# Patient Record
Sex: Male | Born: 1935 | Race: White | Hispanic: No | Smoking: Former smoker
Health system: Southern US, Community
[De-identification: ages and names within clinical notes are randomized; demographics above are authoritative.]

## PROBLEM LIST (undated history)

## (undated) DIAGNOSIS — I10 Essential (primary) hypertension: Secondary | ICD-10-CM

## (undated) DIAGNOSIS — K219 Gastro-esophageal reflux disease without esophagitis: Secondary | ICD-10-CM

## (undated) DIAGNOSIS — M069 Rheumatoid arthritis, unspecified: Secondary | ICD-10-CM

## (undated) DIAGNOSIS — J449 Chronic obstructive pulmonary disease, unspecified: Secondary | ICD-10-CM

## (undated) DIAGNOSIS — C4491 Basal cell carcinoma of skin, unspecified: Secondary | ICD-10-CM

## (undated) HISTORY — DX: Essential (primary) hypertension: I10

## (undated) HISTORY — PX: MOHS SURGERY: SHX181

## (undated) HISTORY — DX: Chronic obstructive pulmonary disease, unspecified: J44.9

## (undated) HISTORY — DX: Gastro-esophageal reflux disease without esophagitis: K21.9

---

## 2006-06-24 ENCOUNTER — Other Ambulatory Visit: Payer: Self-pay

## 2006-06-24 ENCOUNTER — Inpatient Hospital Stay: Payer: Self-pay | Admitting: Internal Medicine

## 2006-06-24 ENCOUNTER — Ambulatory Visit: Payer: Self-pay | Admitting: Family Medicine

## 2006-06-27 ENCOUNTER — Other Ambulatory Visit: Payer: Self-pay

## 2008-02-05 ENCOUNTER — Emergency Department: Payer: Self-pay | Admitting: Emergency Medicine

## 2010-09-12 ENCOUNTER — Inpatient Hospital Stay: Payer: Self-pay | Admitting: Internal Medicine

## 2011-12-06 ENCOUNTER — Emergency Department: Payer: Self-pay | Admitting: Emergency Medicine

## 2011-12-06 LAB — COMPREHENSIVE METABOLIC PANEL
Albumin: 2.8 g/dL — ABNORMAL LOW (ref 3.4–5.0)
Anion Gap: 6 — ABNORMAL LOW (ref 7–16)
BUN: 18 mg/dL (ref 7–18)
Bilirubin,Total: 0.4 mg/dL (ref 0.2–1.0)
Chloride: 103 mmol/L (ref 98–107)
EGFR (African American): 53 — ABNORMAL LOW
EGFR (Non-African Amer.): 45 — ABNORMAL LOW
Glucose: 146 mg/dL — ABNORMAL HIGH (ref 65–99)
Osmolality: 284 (ref 275–301)
Potassium: 3.7 mmol/L (ref 3.5–5.1)
Sodium: 140 mmol/L (ref 136–145)
Total Protein: 6.3 g/dL — ABNORMAL LOW (ref 6.4–8.2)

## 2011-12-06 LAB — CK TOTAL AND CKMB (NOT AT ARMC): CK, Total: 37 U/L (ref 35–232)

## 2011-12-06 LAB — CBC
HCT: 30.4 % — ABNORMAL LOW (ref 40.0–52.0)
MCH: 24.4 pg — ABNORMAL LOW (ref 26.0–34.0)
MCHC: 31.3 g/dL — ABNORMAL LOW (ref 32.0–36.0)
MCV: 78 fL — ABNORMAL LOW (ref 80–100)
Platelet: 345 10*3/uL (ref 150–440)
RBC: 3.89 10*6/uL — ABNORMAL LOW (ref 4.40–5.90)
WBC: 10.1 10*3/uL (ref 3.8–10.6)

## 2012-12-05 ENCOUNTER — Emergency Department: Payer: Self-pay | Admitting: Emergency Medicine

## 2012-12-05 LAB — URINALYSIS, COMPLETE
Bacteria: NONE SEEN
Bilirubin,UR: NEGATIVE
Blood: NEGATIVE
Leukocyte Esterase: NEGATIVE
Protein: NEGATIVE
Squamous Epithelial: NONE SEEN
WBC UR: 1 /HPF (ref 0–5)

## 2012-12-05 LAB — BASIC METABOLIC PANEL
Anion Gap: 4 — ABNORMAL LOW (ref 7–16)
Calcium, Total: 8.7 mg/dL (ref 8.5–10.1)
Chloride: 104 mmol/L (ref 98–107)
Glucose: 116 mg/dL — ABNORMAL HIGH (ref 65–99)
Potassium: 3.7 mmol/L (ref 3.5–5.1)

## 2012-12-05 LAB — CBC
HCT: 41.4 % (ref 40.0–52.0)
HGB: 14 g/dL (ref 13.0–18.0)
MCH: 29.9 pg (ref 26.0–34.0)
MCHC: 33.8 g/dL (ref 32.0–36.0)
MCV: 89 fL (ref 80–100)
Platelet: 230 10*3/uL (ref 150–440)
RBC: 4.68 10*6/uL (ref 4.40–5.90)
RDW: 18.6 % — ABNORMAL HIGH (ref 11.5–14.5)
WBC: 9.2 10*3/uL (ref 3.8–10.6)

## 2012-12-07 ENCOUNTER — Emergency Department: Payer: Self-pay | Admitting: Emergency Medicine

## 2012-12-07 LAB — URINALYSIS, COMPLETE
Bacteria: NONE SEEN
Bilirubin,UR: NEGATIVE
Blood: NEGATIVE
Ketone: NEGATIVE
Nitrite: NEGATIVE
Ph: 5 (ref 4.5–8.0)
Specific Gravity: 1.012 (ref 1.003–1.030)
Squamous Epithelial: NONE SEEN
WBC UR: NONE SEEN /HPF (ref 0–5)

## 2012-12-07 LAB — CBC
HCT: 40.7 % (ref 40.0–52.0)
HGB: 13.7 g/dL (ref 13.0–18.0)
MCH: 29.5 pg (ref 26.0–34.0)
MCHC: 33.6 g/dL (ref 32.0–36.0)
MCV: 88 fL (ref 80–100)
Platelet: 258 10*3/uL (ref 150–440)
RBC: 4.62 10*6/uL (ref 4.40–5.90)

## 2012-12-07 LAB — BASIC METABOLIC PANEL
BUN: 24 mg/dL — ABNORMAL HIGH (ref 7–18)
Chloride: 104 mmol/L (ref 98–107)
Creatinine: 1.75 mg/dL — ABNORMAL HIGH (ref 0.60–1.30)
EGFR (African American): 40 — ABNORMAL LOW
Osmolality: 277 (ref 275–301)
Potassium: 3.9 mmol/L (ref 3.5–5.1)
Sodium: 137 mmol/L (ref 136–145)

## 2013-04-06 ENCOUNTER — Ambulatory Visit: Payer: Self-pay | Admitting: Physician Assistant

## 2013-06-21 ENCOUNTER — Ambulatory Visit: Payer: Self-pay | Admitting: Radiation Oncology

## 2013-06-27 ENCOUNTER — Ambulatory Visit: Payer: Self-pay | Admitting: Radiation Oncology

## 2013-07-27 ENCOUNTER — Ambulatory Visit: Payer: Self-pay | Admitting: Radiation Oncology

## 2013-09-30 ENCOUNTER — Ambulatory Visit: Payer: Self-pay | Admitting: Family Medicine

## 2014-01-10 ENCOUNTER — Ambulatory Visit: Payer: Self-pay | Admitting: Ophthalmology

## 2014-01-10 DIAGNOSIS — I10 Essential (primary) hypertension: Secondary | ICD-10-CM

## 2014-01-10 DIAGNOSIS — Z01810 Encounter for preprocedural cardiovascular examination: Secondary | ICD-10-CM

## 2014-01-10 LAB — HEMOGLOBIN: HGB: 11.7 g/dL — AB (ref 13.0–18.0)

## 2014-01-10 LAB — POTASSIUM: Potassium: 3.3 mmol/L — ABNORMAL LOW (ref 3.5–5.1)

## 2014-01-19 ENCOUNTER — Ambulatory Visit: Payer: Self-pay | Admitting: Ophthalmology

## 2014-02-09 ENCOUNTER — Ambulatory Visit: Payer: Self-pay | Admitting: Ophthalmology

## 2014-02-09 LAB — HEMOGLOBIN: HGB: 11.9 g/dL — ABNORMAL LOW (ref 13.0–18.0)

## 2014-02-09 LAB — POTASSIUM: Potassium: 4.2 mmol/L (ref 3.5–5.1)

## 2014-02-21 ENCOUNTER — Ambulatory Visit: Payer: Self-pay | Admitting: Ophthalmology

## 2014-06-15 ENCOUNTER — Ambulatory Visit: Payer: Self-pay | Admitting: Family Medicine

## 2014-08-19 NOTE — Op Note (Signed)
PATIENT NAME:  [name], [name] MR#:  [id number] DATE OF BIRTH:  1924-10-08  DATE OF PROCEDURE:  02/21/2014  PREOPERATIVE DIAGNOSIS:  Nuclear sclerotic cataract of the right eye.   POSTOPERATIVE DIAGNOSIS:  Nuclear sclerotic cataract of the right eye.   OPERATIVE PROCEDURE:  Cataract extraction by phacoemulsification with implant of intraocular lens to right eye.   SURGEON:  Birder Robson, MD.   ANESTHESIA:  1. Managed anesthesia care.  2. Topical tetracaine drops followed by 2% Xylocaine jelly applied in the preoperative holding area.   COMPLICATIONS:  None.   TECHNIQUE:   Stop and chop.    DESCRIPTION OF PROCEDURE:  The patient was examined and consented in the preoperative holding area where the aforementioned topical anesthesia was applied to the right eye and then brought back to the Operating Room where the right eye was prepped and draped in the usual sterile ophthalmic fashion and a lid speculum was placed. A paracentesis was created with the side port blade and the anterior chamber was filled with viscoelastic. A near clear corneal incision was performed with the steel keratome. A continuous curvilinear capsulorrhexis was performed with a cystotome followed by the capsulorrhexis forceps. Hydrodissection and hydrodelineation were carried out with BSS on a blunt cannula. The lens was removed in a stop and check technique and the remaining cortical material was removed with the irrigation-aspiration handpiece. The capsular bag was inflated with viscoelastic and the Tecnis ZCB00 22.5-diopter lens, serial number [id number] was placed in the capsular bag without complication. The remaining viscoelastic was removed from the eye with the irrigation-aspiration handpiece. The wounds were hydrated. The anterior chamber was flushed with Miostat and the eye was inflated to physiologic pressure. Please note that cefuroxime was not placed within the eye due to PENICILLIN ALLERGY. Rather, a 3:1  dilution of Vigamox was placed in the anterior chamber. The wounds were found to be water tight. The eye was dressed with Vigamox. The patient was given protective glasses to wear throughout the day and a shield with which to sleep tonight. The patient was also given drops with which to begin a drop regimen today and will follow-up with me in one day.     ____________________________ Livingston Diones. Keondrick Dilks, MD wlp:AT D: 02/21/2014 15:39:28 ET T: 02/22/2014 03:51:59 ET JOB#: [id number]  cc: Sharica Roedel L. Taima Rada, MD, <Dictator> Livingston Diones Tilly Pernice MD ELECTRONICALLY SIGNED 02/22/2014 14:13

## 2014-08-19 NOTE — Op Note (Signed)
PATIENT NAME:  [name], GODBOLT MR#:  [id number] DATE OF BIRTH:  01/13/1925  DATE OF PROCEDURE:  01/19/2014  PREOPERATIVE DIAGNOSIS: Visually significant cataract of the left eye.   POSTOPERATIVE DIAGNOSIS: Visually significant cataract of the left eye.   OPERATIVE PROCEDURE: Cataract extraction by phacoemulsification with implant of intraocular lens to left eye.   SURGEON: Birder Robson, MD.   ANESTHESIA:  1. Managed anesthesia care.  2. Topical tetracaine drops followed by 2% Xylocaine jelly applied in the preoperative holding area.   COMPLICATIONS: None.   TECHNIQUE:  Stop and chop.    DESCRIPTION OF PROCEDURE: The patient was examined and consented in the preoperative holding area where the aforementioned topical anesthesia was applied to the left eye and then brought back to the Operating Room where the left eye was prepped and draped in the usual sterile ophthalmic fashion and a lid speculum was placed. A paracentesis was created with the side port blade and the anterior chamber was filled with viscoelastic. A near clear corneal incision was performed with the steel keratome. A continuous curvilinear capsulorrhexis was performed with a cystotome followed by the capsulorrhexis forceps. Hydrodissection and hydrodelineation were carried out with BSS on a blunt cannula. The lens was removed in a stop and chop technique and the remaining cortical material was removed with the irrigation-aspiration handpiece. The capsular bag was inflated with viscoelastic and the Tecnis ZCB00 23.0-diopter lens, serial number [id number] was placed in the capsular bag without complication. The remaining viscoelastic was removed from the eye with the irrigation-aspiration handpiece. The wounds were hydrated. The anterior chamber was flushed with Miostat and the eye was inflated to physiologic pressure. Please note that Cefuroxime was not used within the eye due to a PENICILLIN ALLERGY;  rather a 4:1 dilution of  Vigamox was placed. The wounds were found to be water tight. The eye was dressed with Vigamox. The patient was given protective glasses to wear throughout the day and a shield with which to sleep tonight. The patient was also given drops with which to begin a drop regimen today and will follow-up with me in one day.    ____________________________ Livingston Diones. Jasiya Markie, MD wlp:bu D: 01/19/2014 17:09:51 ET T: 01/19/2014 19:38:54 ET JOB#: [id number]  cc: Sylina Henion L. Rhet Rorke, MD, <Dictator> Livingston Diones Flornce Record MD ELECTRONICALLY SIGNED 01/20/2014 9:34

## 2014-08-19 NOTE — Consult Note (Signed)
Diagnosis:  Chief Complaint/Diagnosis   patient is an 81 year old male multiple ear surgeries include almost complete sacrifice of his right year with [facility] chemosurgery for basal cell carcinomas. He is also at least 3 areas radiated on his face and neck according to his daughter. Seen today for persistent recurrent disease deep within the ear canal. Patient had all his treatments at [facility] and I have asked them to consider reevaluation there since he's had 70 fields of radiation and some a surgeries there would best fit his continuity of care. Certainly will offer him radiation therapy here showed a cyst upon that. We have scheduled him for examination and followup in radiation oncology at [facility]. No formal consultation was performed today.  Allergies:   Penicillin: Other  Electronic Signatures: Armstead Peaks (MD)  (Signed 02-Mar-15 15:02)  Authored: Diagnosis, Allergies   Last Updated: 02-Mar-15 15:02 by Armstead Peaks (MD)

## 2014-10-09 ENCOUNTER — Other Ambulatory Visit: Payer: Self-pay | Admitting: Family Medicine

## 2014-10-09 DIAGNOSIS — K219 Gastro-esophageal reflux disease without esophagitis: Secondary | ICD-10-CM

## 2014-11-07 ENCOUNTER — Other Ambulatory Visit: Payer: Self-pay | Admitting: Family Medicine

## 2014-11-07 DIAGNOSIS — K219 Gastro-esophageal reflux disease without esophagitis: Secondary | ICD-10-CM

## 2014-12-05 ENCOUNTER — Other Ambulatory Visit: Payer: Self-pay

## 2014-12-05 DIAGNOSIS — J449 Chronic obstructive pulmonary disease, unspecified: Secondary | ICD-10-CM

## 2014-12-05 MED ORDER — BUDESONIDE-FORMOTEROL FUMARATE 160-4.5 MCG/ACT IN AERO: 2.0000 | INHALATION_SPRAY | Freq: Two times a day (BID) | RESPIRATORY_TRACT | Status: DC

## 2014-12-05 MED ORDER — TIOTROPIUM BROMIDE MONOHYDRATE 18 MCG IN CAPS: 18.0000 ug | ORAL_CAPSULE | Freq: Every day | RESPIRATORY_TRACT | Status: DC

## 2015-01-03 ENCOUNTER — Other Ambulatory Visit: Payer: Self-pay | Admitting: Family Medicine

## 2015-01-03 DIAGNOSIS — I10 Essential (primary) hypertension: Secondary | ICD-10-CM

## 2015-01-09 ENCOUNTER — Ambulatory Visit (INDEPENDENT_AMBULATORY_CARE_PROVIDER_SITE_OTHER): Payer: Medicare Other | Admitting: Family Medicine

## 2015-01-09 ENCOUNTER — Encounter: Payer: Self-pay | Admitting: Family Medicine

## 2015-01-09 VITALS — BP 124/64 | HR 80 | Ht 72.0 in | Wt 141.0 lb

## 2015-01-09 DIAGNOSIS — J449 Chronic obstructive pulmonary disease, unspecified: Secondary | ICD-10-CM

## 2015-01-09 DIAGNOSIS — J432 Centrilobular emphysema: Secondary | ICD-10-CM

## 2015-01-09 DIAGNOSIS — Z23 Encounter for immunization: Secondary | ICD-10-CM

## 2015-01-09 DIAGNOSIS — K219 Gastro-esophageal reflux disease without esophagitis: Secondary | ICD-10-CM

## 2015-01-09 DIAGNOSIS — I10 Essential (primary) hypertension: Secondary | ICD-10-CM | POA: Diagnosis not present

## 2015-01-09 MED ORDER — LISINOPRIL-HYDROCHLOROTHIAZIDE 20-25 MG PO TABS: 1.0000 | ORAL_TABLET | Freq: Every day | ORAL | Status: AC

## 2015-01-09 MED ORDER — BUDESONIDE-FORMOTEROL FUMARATE 160-4.5 MCG/ACT IN AERO: 2.0000 | INHALATION_SPRAY | Freq: Two times a day (BID) | RESPIRATORY_TRACT | Status: AC

## 2015-01-09 MED ORDER — TIOTROPIUM BROMIDE MONOHYDRATE 18 MCG IN CAPS: 18.0000 ug | ORAL_CAPSULE | Freq: Every day | RESPIRATORY_TRACT | Status: AC

## 2015-01-09 MED ORDER — RANITIDINE HCL 150 MG PO TABS: 150.0000 mg | ORAL_TABLET | Freq: Every day | ORAL | Status: AC

## 2015-01-09 NOTE — Progress Notes (Signed)
Name: [name]   MRN: [id number]    DOB: Apr 25, 1925   Date:01/09/2015       Progress Note  Subjective  Chief Complaint  Chief Complaint  Patient presents with  . Hypertension  . Gastrophageal Reflux  . COPD    Hypertension This is a recurrent problem. The current episode started more than 1 year ago. The problem has been gradually improving since onset. The problem is controlled. Pertinent negatives include no anxiety, blurred vision, chest pain, headaches, malaise/fatigue, neck pain, orthopnea, palpitations, peripheral edema, PND, shortness of breath or sweats. There are no associated agents to hypertension. Risk factors for coronary artery disease include male gender, post-menopausal state and smoking/tobacco exposure. Past treatments include diuretics and ACE inhibitors. The current treatment provides mild improvement. There are no compliance problems.  There is no history of angina, kidney disease, CAD/MI, CVA, heart failure, left ventricular hypertrophy, PVD, renovascular disease or retinopathy. There is no history of chronic renal disease.  Gastrophageal Reflux He reports no abdominal pain, no belching, no chest pain, no choking, no coughing, no dysphagia, no early satiety, no globus sensation, no heartburn, no hoarse voice, no nausea, no sore throat, no stridor, no tooth decay, no water brash or no wheezing. This is a chronic problem. The current episode started more than 1 year ago. The problem occurs occasionally. The problem has been gradually improving. The symptoms are aggravated by certain foods. Pertinent negatives include no anemia, fatigue, melena, orthopnea or weight loss. Risk factors include smoking/tobacco exposure. He has tried a histamine-2 antagonist for the symptoms. The treatment provided moderate relief.  Breathing Problem He complains of difficulty breathing. There is no chest tightness, cough, hoarse voice, shortness of breath, sputum production or wheezing.  This is a chronic problem. The current episode started more than 1 year ago. The problem has been unchanged ("fair sometimes"). Associated symptoms include dyspnea on exertion. Pertinent negatives include no chest pain, ear pain, fever, headaches, heartburn, malaise/fatigue, myalgias, orthopnea, PND, sore throat, sweats or weight loss. His symptoms are alleviated by beta-agonist and ipratropium. He reports moderate improvement on treatment.    No problem-specific assessment & plan notes found for this encounter.   Past Medical History  Diagnosis Date  . GERD (gastroesophageal reflux disease)   . Hypertension   . COPD (chronic obstructive pulmonary disease)     Past Surgical History  Procedure Laterality Date  . Mohs surgery      History reviewed. No pertinent family history.  Social History   Social History  . Marital Status: Divorced    Spouse Name: N/A  . Number of Children: N/A  . Years of Education: N/A   Occupational History  . Not on file.   Social History Main Topics  . Smoking status: Former Research scientist (life sciences)  . Smokeless tobacco: Current User    Types: Chew  . Alcohol Use: No  . Drug Use: No  . Sexual Activity: No   Other Topics Concern  . Not on file   Social History Narrative  . No narrative on file    Allergies  Allergen Reactions  . Albuterol Other (See Comments)    trembling, lung infection(?)  . Penicillins Other (See Comments)     Review of Systems  Constitutional: Negative for fever, chills, weight loss, malaise/fatigue and fatigue.  HENT: Negative for ear discharge, ear pain, hoarse voice and sore throat.   Eyes: Negative for blurred vision.  Respiratory: Negative for cough, sputum production, choking, shortness of breath and wheezing.  Cardiovascular: Positive for dyspnea on exertion. Negative for chest pain, palpitations, orthopnea, leg swelling and PND.  Gastrointestinal: Negative for heartburn, dysphagia, nausea, abdominal pain, diarrhea,  constipation, blood in stool and melena.  Genitourinary: Negative for dysuria, urgency, frequency and hematuria.  Musculoskeletal: Negative for myalgias, back pain, joint pain and neck pain.  Skin: Negative for rash.  Neurological: Negative for dizziness, tingling, sensory change, focal weakness and headaches.  Endo/Heme/Allergies: Negative for environmental allergies and polydipsia. Does not bruise/bleed easily.  Psychiatric/Behavioral: Negative for depression and suicidal ideas. The patient is not nervous/anxious and does not have insomnia.      Objective  Filed Vitals:   01/09/15 1040  BP: 124/64  Pulse: 80  Height: 6' (1.829 m)  Weight: 141 lb (63.957 kg)    Physical Exam  Constitutional: He is oriented to person, place, and time and well-developed, well-nourished, and in no distress.  HENT:  Head: Normocephalic.  Right Ear: External ear normal.  Left Ear: External ear normal.  Nose: Nose normal.  Mouth/Throat: Oropharynx is clear and moist.  Eyes: Conjunctivae and EOM are normal. Pupils are equal, round, and reactive to light. Right eye exhibits no discharge. Left eye exhibits no discharge. No scleral icterus.  Neck: Normal range of motion. Neck supple. No JVD present. No tracheal deviation present. No thyromegaly present.  Cardiovascular: Normal rate, regular rhythm, normal heart sounds and intact distal pulses.  Exam reveals no gallop and no friction rub.   No murmur heard. Pulmonary/Chest: Breath sounds normal. No respiratory distress. He has no wheezes. He has no rales.  Abdominal: Soft. Bowel sounds are normal. He exhibits no mass. There is no hepatosplenomegaly. There is no tenderness. There is no rebound, no guarding and no CVA tenderness.  Musculoskeletal: Normal range of motion. He exhibits no edema or tenderness.  Lymphadenopathy:    He has no cervical adenopathy.  Neurological: He is alert and oriented to person, place, and time. He has normal sensation, normal  strength, normal reflexes and intact cranial nerves. No cranial nerve deficit.  Skin: Skin is warm. No rash noted.  Psychiatric: Mood and affect normal.      Assessment & Plan  Problem List Items Addressed This Visit      Cardiovascular and Mediastinum   Essential hypertension - Primary   Relevant Medications   lisinopril-hydrochlorothiazide (PRINZIDE,ZESTORETIC) 20-25 MG per tablet   Other Relevant Orders   Renal Function Panel     Respiratory   Centrilobular emphysema   Relevant Medications   budesonide-formoterol (SYMBICORT) 160-4.5 MCG/ACT inhaler   tiotropium (SPIRIVA HANDIHALER) 18 MCG inhalation capsule     Digestive   Esophageal reflux   Relevant Medications   ranitidine (ZANTAC) 150 MG tablet    Other Visit Diagnoses    Chronic obstructive pulmonary disease, unspecified COPD, unspecified chronic bronchitis type        Relevant Medications    budesonide-formoterol (SYMBICORT) 160-4.5 MCG/ACT inhaler    tiotropium (SPIRIVA HANDIHALER) 18 MCG inhalation capsule    GERD without esophagitis        Relevant Medications    ranitidine (ZANTAC) 150 MG tablet    Need for influenza vaccination        Relevant Orders    Flu Vaccine QUAD 36+ mos PF IM (Fluarix & Fluzone Quad PF) (Completed)         Dr. Deanna Jones [facility]  01/09/2015

## 2015-01-10 LAB — RENAL FUNCTION PANEL
ALBUMIN: 3.6 g/dL (ref 3.5–4.7)
BUN / CREAT RATIO: 16 (ref 10–22)
BUN: 17 mg/dL (ref 8–27)
CALCIUM: 8.8 mg/dL (ref 8.6–10.2)
CHLORIDE: 98 mmol/L (ref 97–108)
CO2: 29 mmol/L (ref 18–29)
Creatinine, Ser: 1.05 mg/dL (ref 0.76–1.27)
GFR calc Af Amer: 73 mL/min/{1.73_m2} (ref >59)
GFR calc non Af Amer: 63 mL/min/{1.73_m2} (ref >59)
Glucose: 58 mg/dL — ABNORMAL LOW (ref 65–99)
Phosphorus: 2.8 mg/dL (ref 2.5–4.5)
Potassium: 3.8 mmol/L (ref 3.5–5.2)
Sodium: 142 mmol/L (ref 134–144)

## 2015-04-06 ENCOUNTER — Encounter: Payer: Medicare Other | Attending: Surgery | Admitting: Surgery

## 2015-04-06 DIAGNOSIS — Z880 Allergy status to penicillin: Secondary | ICD-10-CM | POA: Insufficient documentation

## 2015-04-06 DIAGNOSIS — Z923 Personal history of irradiation: Secondary | ICD-10-CM | POA: Diagnosis not present

## 2015-04-06 DIAGNOSIS — Z9221 Personal history of antineoplastic chemotherapy: Secondary | ICD-10-CM | POA: Diagnosis not present

## 2015-04-06 DIAGNOSIS — C4440 Unspecified malignant neoplasm of skin of scalp and neck: Secondary | ICD-10-CM | POA: Insufficient documentation

## 2015-04-06 DIAGNOSIS — K219 Gastro-esophageal reflux disease without esophagitis: Secondary | ICD-10-CM | POA: Insufficient documentation

## 2015-04-06 DIAGNOSIS — M069 Rheumatoid arthritis, unspecified: Secondary | ICD-10-CM | POA: Insufficient documentation

## 2015-04-06 DIAGNOSIS — J449 Chronic obstructive pulmonary disease, unspecified: Secondary | ICD-10-CM | POA: Diagnosis not present

## 2015-04-06 DIAGNOSIS — I10 Essential (primary) hypertension: Secondary | ICD-10-CM | POA: Insufficient documentation

## 2015-04-06 DIAGNOSIS — C44319 Basal cell carcinoma of skin of other parts of face: Secondary | ICD-10-CM | POA: Diagnosis present

## 2015-04-06 DIAGNOSIS — L98492 Non-pressure chronic ulcer of skin of other sites with fat layer exposed: Secondary | ICD-10-CM | POA: Insufficient documentation

## 2015-04-06 DIAGNOSIS — Z87891 Personal history of nicotine dependence: Secondary | ICD-10-CM | POA: Insufficient documentation

## 2015-04-07 NOTE — Progress Notes (Addendum)
[name], [name] ([id number]) Visit Report for 04/06/2015 Chief Complaint Document Details Patient Name: [name], [name]. Date of Service: 04/06/2015 12:45 PM Medical Record Number: [id number] Patient Account Number: [id number] Date of Birth/Sex: Feb 06, 1925 (81 y.o. Male) Treating RN: Cornell Barman Primary Care Physician: Otilio Miu Other Clinician: Referring Physician: SELF, REFERRED Treating Physician/Extender: Frann Rider in Treatment: 0 Information Obtained from: Patient Chief Complaint Patient presents to the wound care center for a consult due non healing wound to the right ear and neck which has been there for several years and is now getting progressively worse. An open ulceration on the right neck has been there for about 4 months Electronic Signature(s) Signed: 04/06/2015 2:57:55 PM By: Christin Fudge MD, FACS Entered By: Christin Fudge on 04/06/2015 14:57:55 [name], [name] ([id number]) -------------------------------------------------------------------------------- HPI Details Patient Name: [name]. Date of Service: 04/06/2015 12:45 PM Medical Record Number: [id number] Patient Account Number: [id number] Date of Birth/Sex: 14-Oct-1924 (81 y.o. Male) Treating RN: Cornell Barman Primary Care Physician: Otilio Miu Other Clinician: Referring Physician: SELF, REFERRED Treating Physician/Extender: Frann Rider in Treatment: 0 History of Present Illness Location: ulcerated area right neck Quality: Patient reports experiencing a shooting pain to affected area(s). Severity: Patient states wound are getting worse. Duration: Patient has had the wound for > 3 months prior to seeking treatment at the wound center Timing: Pain in wound is constant (hurts all the time) Context: The wound appeared gradually over time Modifying Factors: Consults to this date include:dermatology, PCP and oncology Associated Signs and Symptoms: Patient reports having increase  swelling. 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 He initially had excisional surgery and the disease got progressively worse and recurred. He then had radiation therapy in about 2014. Patient continued to have progressive disease and then had a large neck mass which was treated with Erivedge and this was given to him regularly right up to May. The patient had a lot of issues with the chemotherapy and with radiation therapy and has stopped all treatment and did not want to go back to the cancer center. His daughter was at the bedside has been doing his dressings and noted that this has now ulcerated over the last 3 months and she has been doing local care and was concerned about infection. I do not believe she understands the gravity of the situation. Electronic Signature(s) Signed: 04/06/2015 3:29:50 PM By: Christin Fudge MD, FACS Entered By: Christin Fudge on 04/06/2015 15:29:50 [name], [name] ([id number]) -------------------------------------------------------------------------------- Physical Exam Details Patient Name: [name], [name]. Date of Service: 04/06/2015 12:45 PM Medical Record Number: [id number] Patient Account Number: [id number] Date of Birth/Sex: January 20, 1925 (81 y.o. Male) Treating RN: Cornell Barman Primary Care Physician: Otilio Miu Other Clinician: Referring Physician: SELF, REFERRED Treating Physician/Extender: Frann Rider in Treatment: 0 Constitutional . Pulse regular. Respirations normal and unlabored. Afebrile. . Eyes Nonicteric. Reactive to light. Ears, Nose, Mouth, and Throat Lips, teeth, and gums WNL.Marland Kitchen Moist mucosa without lesions . Neck the patient has part of his right ear excised and he has a large hard fungating mass in the region of his mastoid and upper neck region with ulceration down to the subcutaneous tissue.. Normal sized without goiter. Respiratory WNL. No  retractions.. Cardiovascular Pedal Pulses WNL. No clubbing, cyanosis or edema. Gastrointestinal (GI) Abdomen without masses or tenderness.. No liver or spleen enlargement or tenderness.. Lymphatic No adneopathy. No adenopathy. No adenopathy. Musculoskeletal Adexa without tenderness or enlargement.. Digits  and nails w/o clubbing, cyanosis, infection, petechiae, ischemia, or inflammatory conditions.. Integumentary (Hair, Skin) No suspicious lesions. No crepitus or fluctuance. No peri-wound warmth or erythema. No masses.Marland Kitchen Psychiatric Judgement and insight Intact.. No evidence of depression, anxiety, or agitation.. Notes the patient has part of his right ear excised and he has a large hard fungating mass in the region of his mastoid and upper neck region with ulceration down to the subcutaneous tissue. the mass is fixed and tender and there is a fungating base. Electronic Signature(s) Signed: 04/06/2015 3:31:40 PM By: Christin Fudge MD, FACS Entered By: Christin Fudge on 04/06/2015 15:31:40 [name], [name] ([id number]) -------------------------------------------------------------------------------- Physician Orders Details Patient Name: [name] Date of Service: 04/06/2015 12:45 PM Medical Record Number: [id number] Patient Account Number: [id number] Date of Birth/Sex: Apr 21, 1925 (81 y.o. Male) Treating RN: Cornell Barman Primary Care Physician: Otilio Miu Other Clinician: Referring Physician: SELF, REFERRED Treating Physician/Extender: Frann Rider in Treatment: 0 Verbal / Phone Orders: Yes Clinician: Cornell Barman Read Back and Verified: Yes Diagnosis Coding Wound Cleansing Wound #1 Right Neck o Clean wound with Normal Saline. Wound #2 Right Ear o Clean wound with Normal Saline. Anesthetic Wound #1 Right Neck o Topical Lidocaine 4% cream applied to wound bed prior to debridement Wound #2 Right Ear o Topical Lidocaine 4% cream applied to wound bed prior to  debridement Primary Wound Dressing Wound #1 Right Neck o Aquacel Ag Wound #2 Right Ear o Aquacel Ag Secondary Dressing Wound #1 Right Neck o Non-adherent pad o ABD pad Wound #2 Right Ear o Non-adherent pad o ABD pad Dressing Change Frequency Wound #1 Right Neck o Change dressing every day. Wound #2 Right Ear o Change dressing every day. JUSTO, MOWBRAY ([id number]) Follow-up Appointments Wound #1 Right Neck o Return Appointment in 1 week. Wound #2 Right Ear o Return Appointment in 1 week. Electronic Signature(s) Signed: 04/06/2015 3:49:12 PM By: Gretta Cool RN, BSN, Kim RN, BSN Signed: 04/06/2015 4:13:31 PM By: Christin Fudge MD, FACS Entered By: Gretta Cool RN, BSN, Kim on 04/06/2015 13:55:55 [name], [name] ([id number]) -------------------------------------------------------------------------------- Problem List Details Patient Name: [name], [name]. Date of Service: 04/06/2015 12:45 PM Medical Record Number: [id number] Patient Account Number: [id number] Date of Birth/Sex: 1925-02-03 (81 y.o. 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 Date of Service: 04/06/2015 12:45  PM Medical Record Number: [id number] Patient Account Number: [id number] Date of Birth/Sex: [birth year] (81 y.o. Male) Treating RN: Cornell Barman Primary Care Physician: Otilio Miu Other Clinician: Referring Physician: SELF, REFERRED Treating Physician/Extender: Frann Rider in Treatment: 0 Subjective Chief Complaint Information obtained from Patient Patient presents to the wound care center for a consult due non healing wound to the right ear and neck which has been there for several years and is now getting progressively worse. An open ulceration on the right neck has been there for about 4 months History of Present Illness (HPI) The following HPI elements were  documented for the patient's wound: Location: ulcerated area right neck Quality: Patient reports experiencing a shooting pain to affected area(s). Severity: Patient states wound are getting worse. Duration: Patient has had the wound for > 3 months prior to seeking treatment at the wound center Timing: Pain in wound is constant (hurts all the time) Context: The wound appeared gradually over time Modifying Factors: Consults to this date include:dermatology, PCP and oncology Associated Signs and Symptoms: Patient reports having increase swelling. 81 year old gentleman who is known to have a basal cell carcinoma of the right ear in 2012. He initially had excisional surgery and the disease got progressively worse and recurred. He then had radiation therapy in about 2014. Patient continued to have progressive disease and then had a large neck mass which was treated with Erivedge and this was given to him regularly right up to May. The patient had a lot of issues with the chemotherapy and with radiation therapy and has stopped all treatment and did not want to go back to the cancer center. His daughter was at the bedside has been doing his dressings and noted that this has now ulcerated over the last 3 months and she has been  doing local care and was concerned about infection. I do not believe she understands the gravity of the situation. Wound History Patient presents with 1 open wound that has been present for approximately 2 mos. Patient has been treating wound in the following manner: cream, gauze. Laboratory tests have not been performed in the last month. Patient reportedly has not tested positive for an antibiotic resistant organism. Patient reportedly has not tested positive for osteomyelitis. Patient experiences the following problems associated with their wounds: infection. Patient History Information obtained from Patient, Caregiver. ANAKIN, NOLETTE ([id number]) Allergies penicillin Family History Stroke - Mother, Father, Paternal Grandparents, Maternal Grandparents, No family history of Cancer, Diabetes, Heart Disease, Hypertension, Kidney Disease, Lung Disease, Seizures, Thyroid Problems, Tuberculosis. Social History Former smoker, Marital Status - Divorced, Alcohol Use - Never, Drug Use - No History, Caffeine Use - Daily. Medical History Eyes Patient has history of Cataracts - November 2015 Denies history of Glaucoma, Optic Neuritis Ear/Nose/Mouth/Throat Denies history of Chronic sinus problems/congestion, Middle ear problems Hematologic/Lymphatic Patient has history of Anemia Denies history of Hemophilia, Human Immunodeficiency Virus, Lymphedema, Sickle Cell Disease Respiratory Patient has history of Chronic Obstructive Pulmonary Disease (COPD) Denies history of Aspiration, Pneumothorax, Sleep Apnea, Tuberculosis Cardiovascular Patient has history of Hypertension Denies history of Angina, Arrhythmia, Congestive Heart Failure, Coronary Artery Disease, Deep Vein Thrombosis, Hypotension, Myocardial Infarction, Peripheral Arterial Disease, Peripheral Venous Disease, Phlebitis, Vasculitis Gastrointestinal Denies history of Cirrhosis , Colitis, Crohn s, Hepatitis A, Hepatitis B, Hepatitis  C Endocrine Denies history of Type I Diabetes, Type II Diabetes Genitourinary Denies history of End Stage Renal Disease Immunological Denies history of Lupus Erythematosus, Raynaud s, Scleroderma Integumentary (Skin) Denies history of History of Burn, History of pressure wounds Musculoskeletal Patient has history of Rheumatoid Arthritis Denies history of Gout, Osteoarthritis, Osteomyelitis Neurologic Denies history of Dementia, Neuropathy, Quadriplegia, Paraplegia Oncologic Patient has history of Received Chemotherapy, Received Radiation Psychiatric Denies history of Anorexia/bulimia, Confinement Anxiety Medical And Surgical History Notes [name], [name] ([id number]) Constitutional Symptoms (General Health) Basal Cell; Rheumatoud Arth; COPD; HTN; Gastric Reflux Review of Systems (ROS) Constitutional Symptoms (General Health) The patient has no complaints or symptoms. Eyes The patient has no complaints or symptoms. Ear/Nose/Mouth/Throat The patient has no complaints or symptoms. Hematologic/Lymphatic The patient has no complaints or symptoms. Respiratory Complains or  has symptoms of Shortness of Breath. Cardiovascular The patient has no complaints or symptoms. Gastrointestinal The patient has no complaints or symptoms. Endocrine The patient has no complaints or symptoms. Genitourinary The patient has no complaints or symptoms. Immunological The patient has no complaints or symptoms. Integumentary (Skin) Complains or has symptoms of Wounds, Bleeding or bruising tendency. Musculoskeletal The patient has no complaints or symptoms. Neurologic The patient has no complaints or symptoms. Oncologic Basal Cell Psychiatric The patient has no complaints or symptoms. Objective Constitutional Pulse regular. Respirations normal and unlabored. Afebrile. Vitals Time Taken: 1:06 PM, Height: 72 in, Weight: 141 lbs, BMI: 19.1, Temperature: 97.5 F, Pulse: 107 bpm, Respiratory  Rate: 20 breaths/min, Blood Pressure: 114/67 mmHg. Eyes Ceballos, Tredarius C. ([id number]) Nonicteric. Reactive to light. Ears, Nose, Mouth, and Throat Lips, teeth, and gums WNL.Marland Kitchen Moist mucosa without lesions . Neck the patient has part of his right ear excised and he has a large hard fungating mass in the region of his mastoid and upper neck region with ulceration down to the subcutaneous tissue.. Normal sized without goiter. Respiratory WNL. No retractions.. Cardiovascular Pedal Pulses WNL. No clubbing, cyanosis or edema. Gastrointestinal (GI) Abdomen without masses or tenderness.. No liver or spleen enlargement or tenderness.. Lymphatic No adneopathy. No adenopathy. No adenopathy. Musculoskeletal Adexa without tenderness or enlargement.. Digits and nails w/o clubbing, cyanosis, infection, petechiae, ischemia, or inflammatory conditions.Marland Kitchen Psychiatric Judgement and insight Intact.. No evidence of depression, anxiety, or agitation.. General Notes: the patient has part of his right ear excised and he has a large hard fungating mass in the region of his mastoid and upper neck region with ulceration down to the subcutaneous tissue. the mass is fixed and tender and there is a fungating base. Integumentary (Hair, Skin) No suspicious lesions. No crepitus or fluctuance. No peri-wound warmth or erythema. No masses.. Wound #1 status is Open. Original cause of wound was Gradually Appeared. The wound is located on the Right Neck. The wound measures 3cm length x 2.8cm width x 1.5cm depth; 6.597cm^2 area and 9.896cm^3 volume. The wound is limited to skin breakdown. There is a large amount of serous drainage noted. The wound margin is well defined and not attached to the wound base. There is no granulation within the wound bed. There is a large (67-100%) amount of necrotic tissue within the wound bed including Adherent Slough. The periwound skin appearance exhibited: Maceration, Moist. The periwound  skin appearance did not exhibit: Callus, Crepitus, Excoriation, Fluctuance, Friable, Induration, Localized Edema, Rash, Scarring, Dry/Scaly, Atrophie Blanche, Cyanosis, Ecchymosis, Hemosiderin Staining, Mottled, Pallor, Rubor, Erythema. Periwound temperature was noted as No Abnormality. The periwound has tenderness on palpation. Wound #2 status is Open. Original cause of wound was Gradually Appeared. The wound is located on the Right Ear. The wound measures 2cm length x 1.5cm width x 0.1cm depth; 2.356cm^2 area and 0.236cm^3 volume. The wound is limited to skin breakdown. There is a medium amount of serous drainage noted. The [name], [name] ([id number]) wound margin is indistinct and nonvisible. There is no granulation within the wound bed. There is a large (67-100%) amount of necrotic tissue within the wound bed including Eschar. The periwound skin appearance exhibited: Scarring. The periwound skin appearance did not exhibit: Callus, Crepitus, Excoriation, Fluctuance, Friable, Induration, Localized Edema, Rash, Dry/Scaly, Maceration, Moist, Atrophie Blanche, Cyanosis, Ecchymosis, Hemosiderin Staining, Mottled, Pallor, Rubor, Erythema. Assessment Active Problems ICD-10 C44.319 - Basal cell carcinoma of skin of other parts of face C44.40 - Unspecified malignant neoplasm of skin of scalp and neck  [id number] - Non-pressure chronic ulcer of skin of other sites with fat layer exposed This 81 year old gentleman has a locally advanced, possible metastatic basal cell carcinoma which is in his right upper neck and angle of the mandible region. This tumor is fungating, fixed and tender. The daughter who is his caregiver and I have had detailed discussion regarding the pathophysiology of his problem and I have clearly explained that the management offered at our wound center is going to be palliative and in no way going to help cure this or heal this ulcer. She understands this. She did not comprehend  the gravity of this disease and how it may end up and after answering all her questions I believe she clearly understands the prognosis, which is dismal. The patient is not willing to undergo any further investigations or treatment at the [facility] and hence at this stage I understand he is here for palliative treatment only. I have recommended gently packing this wound with Aquacel Ag rope and covering it with gauze and tape to keep in place. We would be happy to see the patient back as often as needed and we do try control order and discharge from this wound. Plan Wound Cleansing: Wound #1 Right Neck: Clean wound with Normal Saline. Wound #2 Right Ear: Clean wound with Normal Saline. Anesthetic: [name], [name] ([id number]) Wound #1 Right Neck: Topical Lidocaine 4% cream applied to wound bed prior to debridement Wound #2 Right Ear: Topical Lidocaine 4% cream applied to wound bed prior to debridement Primary Wound Dressing: Wound #1 Right Neck: Aquacel Ag Wound #2 Right Ear: Aquacel Ag Secondary Dressing: Wound #1 Right Neck: Non-adherent pad ABD pad Wound #2 Right Ear: Non-adherent pad ABD pad Dressing Change Frequency: Wound #1 Right Neck: Change dressing every day. Wound #2 Right Ear: Change dressing every day. Follow-up Appointments: Wound #1 Right Neck: Return Appointment in 1 week. Wound #2 Right Ear: Return Appointment in 1 week. This 81 year old gentleman has a locally advanced, possible metastatic basal cell carcinoma which is in his right upper neck and angle of the mandible region. This tumor is fungating, fixed and tender. The daughter who is his caregiver and I have had detailed discussion regarding the pathophysiology of his problem and I have clearly explained that the management offered at our wound center is going to be palliative and in no way going to help cure this or heal this ulcer. She understands this. She did not comprehend the  gravity of this disease and how it may end up and after answering all her questions I believe she clearly understands the prognosis, which is dismal. The patient is not willing to undergo any further investigations or treatment at the [facility] and hence at this stage I understand he is here for palliative treatment only. I have recommended gently packing this wound with Aquacel Ag rope and covering it with gauze and tape to keep in place. We would be happy to see the patient back as often as needed and we do try control order and discharge from this wound. GORO, SHOWELL ([id number]) Electronic Signature(s) Signed: 04/09/2015 4:37:42 PM By: Christin Fudge MD, FACS Previous Signature: 04/06/2015 3:55:32 PM Version By: Christin Fudge MD, FACS Previous Signature: 04/06/2015 3:36:34 PM Version By: Christin Fudge MD, FACS Entered By: Christin Fudge on 04/09/2015 16:37:42 [place], [name] ([id number]) -------------------------------------------------------------------------------- ROS/PFSH Details Patient Name: [name]. Date of Service: 04/06/2015 12:45 PM Medical Record Number: [id number] Patient Account Number: [id number] Date of  Birth/Sex: 01/06/1925 (81 y.o. Male) Treating RN: Cornell Barman Primary Care Physician: Otilio Miu Other Clinician: Referring Physician: SELF, REFERRED Treating Physician/Extender: Frann Rider in Treatment: 0 Information Obtained From Patient Caregiver Wound History Do you currently have one or more open woundso Yes How many open wounds do you currently haveo 1 Approximately how long have you had your woundso 2 mos How have you been treating your wound(s) until nowo cream, gauze Has your wound(s) ever healed and then re-openedo No Have you had any lab work done in the past montho No Have you tested positive for an antibiotic resistant organism (MRSA, VRE)o No Have you tested positive for osteomyelitis (bone infection)o No Have you had  other problems associated with your woundso Infection Respiratory Complaints and Symptoms: Positive for: Shortness of Breath Medical History: Positive for: Chronic Obstructive Pulmonary Disease (COPD) Negative for: Aspiration; Pneumothorax; Sleep Apnea; Tuberculosis Integumentary (Skin) Complaints and Symptoms: Positive for: Wounds; Bleeding or bruising tendency Medical History: Negative for: History of Burn; History of pressure wounds Constitutional Symptoms (General Health) Complaints and Symptoms: No Complaints or Symptoms Medical History: Past Medical History Notes: Basal Cell; Rheumatoud Arth; COPD; HTN; Gastric Reflux Eyes [name], Jamelle C. ([id number]) Complaints and Symptoms: No Complaints or Symptoms Medical History: Positive for: Cataracts - November 2015 Negative for: Glaucoma; Optic Neuritis Ear/Nose/Mouth/Throat Complaints and Symptoms: No Complaints or Symptoms Medical History: Negative for: Chronic sinus problems/congestion; Middle ear problems Hematologic/Lymphatic Complaints and Symptoms: No Complaints or Symptoms Medical History: Positive for: Anemia Negative for: Hemophilia; Human Immunodeficiency Virus; Lymphedema; Sickle Cell Disease Cardiovascular Complaints and Symptoms: No Complaints or Symptoms Medical History: Positive for: Hypertension Negative for: Angina; Arrhythmia; Congestive Heart Failure; Coronary Artery Disease; Deep Vein Thrombosis; Hypotension; Myocardial Infarction; Peripheral Arterial Disease; Peripheral Venous Disease; Phlebitis; Vasculitis Gastrointestinal Complaints and Symptoms: No Complaints or Symptoms Medical History: Negative for: Cirrhosis ; Colitis; Crohnos; Hepatitis A; Hepatitis B; Hepatitis C Endocrine Complaints and Symptoms: No Complaints or Symptoms Medical History: Negative for: Type I Diabetes; Type II Diabetes Arndt, Itamar C. ([id number]) Genitourinary Complaints and Symptoms: No Complaints or  Symptoms Medical History: Negative for: End Stage Renal Disease Immunological Complaints and Symptoms: No Complaints or Symptoms Medical History: Negative for: Lupus Erythematosus; Raynaudos; Scleroderma Musculoskeletal Complaints and Symptoms: No Complaints or Symptoms Medical History: Positive for: Rheumatoid Arthritis Negative for: Gout; Osteoarthritis; Osteomyelitis Neurologic Complaints and Symptoms: No Complaints or Symptoms Medical History: Negative for: Dementia; Neuropathy; Quadriplegia; Paraplegia Oncologic Complaints and Symptoms: Review of System Notes: Basal Cell Medical History: Positive for: Received Chemotherapy; Received Radiation Psychiatric Complaints and Symptoms: No Complaints or Symptoms Medical History: Negative for: Anorexia/bulimia; Confinement Anxiety HBO Extended History Items HARSH, SCIPIO ([id number]) Eyes: Cataracts Family and Social History Cancer: No; Diabetes: No; Heart Disease: No; Hypertension: No; Kidney Disease: No; Lung Disease: No; Seizures: No; Stroke: Yes - Mother, Father, Paternal Grandparents, Maternal Grandparents; Thyroid Problems: No; Tuberculosis: No; Former smoker; Marital Status - Divorced; Alcohol Use: Never; Drug Use: No History; Caffeine Use: Daily; Living Will: No; Medical Power of Attorney: No Physician Affirmation I have reviewed and agree with the above information. Electronic Signature(s) Signed: 04/06/2015 3:55:16 PM By: Christin Fudge MD, FACS Signed: 04/06/2015 5:05:36 PM By: Gretta Cool RN, BSN, Kim RN, BSN Previous Signature: 04/06/2015 3:49:12 PM Version By: Gretta Cool RN, BSN, Kim RN, BSN Entered By: Christin Fudge on 04/06/2015 15:55:16 Hiltz, [name] ([id number]) -------------------------------------------------------------------------------- [place] Details Patient Name: AASIN, DENHART. Date of Service: 04/06/2015 Medical Record Number: [id number] Patient Account Number: [id number] Date of Birth/Sex:  1925/03/21 (  81 y.o. Male) Treating RN: Cornell Barman Primary Care Physician: Otilio Miu Other Clinician: Referring Physician: SELF, REFERRED Treating Physician/Extender: Frann Rider in Treatment: 0 Diagnosis Coding ICD-10 Codes Code Description [id number] Basal cell carcinoma of skin of other parts of face C44.40 Unspecified malignant neoplasm of skin of scalp and neck L98.492 Non-pressure chronic ulcer of skin of other sites with fat layer exposed Facility Procedures CPT4 Code: [id number] Description: 99214 - WOUND CARE VISIT-LEV 4 EST PT Modifier: Quantity: 1 Physician Procedures CPT4 Code Description: [id number] - WC PHYS LEVEL 4 - NEW PT ICD-10 Description Diagnosis C44.319 Basal cell carcinoma of skin of other parts of face C44.40 Unspecified malignant neoplasm of skin of scalp and L98.492 Non-pressure chronic ulcer of skin  of other sites w Modifier: neck ith fat layer Quantity: 1 exposed Electronic Signature(s) Signed: 04/06/2015 3:36:56 PM By: Christin Fudge MD, FACS Entered By: Christin Fudge on 04/06/2015 15:36:56

## 2015-04-07 NOTE — Progress Notes (Addendum)
TAJON, PRESTAGE ([id number]) Visit Report for 04/06/2015 Allergy List Details Patient Name: [name], KENWORTHY. Date of Service: 04/06/2015 12:45 PM Medical Record Number: [id number] Patient Account Number: [id number] Date of Birth/Sex: April 29, 1924 (81 y.o. Male) Treating RN: Cornell Barman Primary Care Physician: Otilio Miu Other Clinician: Referring Physician: SELF, REFERRED Treating Physician/Extender: Frann Rider in Treatment: 0 Allergies Active Allergies penicillin Allergy Notes Electronic Signature(s) Signed: 04/06/2015 3:49:12 PM By: Gretta Cool, RN, BSN, Kim RN, BSN Entered By: Gretta Cool, RN, BSN, Kim on 04/06/2015 13:15:59 Awan, [name] ([id number]) -------------------------------------------------------------------------------- Arrival Information Details Patient Name: [name] Date of Service: 04/06/2015 12:45 PM Medical Record Number: [id number] Patient Account Number: [id number] Date of Birth/Sex: 06-Jun-1924 (81 y.o. Male) Treating RN: Cornell Barman Primary Care Physician: Otilio Miu Other Clinician: Referring Physician: SELF, REFERRED Treating Physician/Extender: Frann Rider in Treatment: 0 Visit Information Patient Arrived: Ambulatory Arrival Time: 13:04 Accompanied By: daughter, [name] Transfer Assistance: None Patient Identification Verified: Yes Secondary Verification Process Yes Completed: Patient Has Alerts: Yes Patient Alerts: Patient on Blood Thinner Electronic Signature(s) Signed: 04/06/2015 3:49:12 PM By: Gretta Cool, RN, BSN, Kim RN, BSN Entered By: Gretta Cool, RN, BSN, Kim on 04/06/2015 15:19:19 Blaschke, [name] ([id number]) -------------------------------------------------------------------------------- Clinic Level of Care Assessment Details Patient Name: [name], [name]. Date of Service: 04/06/2015 12:45 PM Medical Record Number: [id number] Patient Account Number: [id number] Date of Birth/Sex: February 25, 1925 (81 y.o. Male) Treating RN:  Cornell Barman Primary Care Physician: Otilio Miu Other Clinician: Referring Physician: SELF, REFERRED Treating Physician/Extender: Frann Rider in Treatment: 0 Clinic Level of Care Assessment Items TOOL 2 Quantity Score []  - Use when only an EandM is performed on the INITIAL visit 0 ASSESSMENTS - Nursing Assessment / Reassessment X - General Physical Exam (combine w/ comprehensive assessment (listed just 1 20 below) when performed on new pt. evals) X - Comprehensive Assessment (HX, ROS, Risk Assessments, Wounds Hx, etc.) 1 25 ASSESSMENTS - Wound and Skin Assessment / Reassessment X - Simple Wound Assessment / Reassessment - one wound 1 5 []  - Complex Wound Assessment / Reassessment - multiple wounds 0 []  - Dermatologic / Skin Assessment (not related to wound area) 0 ASSESSMENTS - Ostomy and/or Continence Assessment and Care []  - Incontinence Assessment and Management 0 []  - Ostomy Care Assessment and Management (repouching, etc.) 0 PROCESS - Coordination of Care X - Simple Patient / Family Education for ongoing care 1 15 []  - Complex (extensive) Patient / Family Education for ongoing care 0 []  - Staff obtains Programmer, systems, Records, Test Results / Process Orders 0 []  - Staff telephones HHA, Nursing Homes / Clarify orders / etc 0 []  - Routine Transfer to another Facility (non-emergent condition) 0 []  - Routine Hospital Admission (non-emergent condition) 0 X - New Admissions / Biomedical engineer / Ordering NPWT, Apligraf, etc. 1 15 []  - Emergency Hospital Admission (emergent condition) 0 X - Simple Discharge Coordination 1 10 Blacklock, Kee C. ([id number]) []  - Complex (extensive) Discharge Coordination 0 PROCESS - Special Needs []  - Pediatric / Minor Patient Management 0 []  - Isolation Patient Management 0 []  - Hearing / Language / Visual special needs 0 []  - Assessment of Community assistance (transportation, D/C planning, etc.) 0 []  - Additional assistance / Altered  mentation 0 []  - Support Surface(s) Assessment (bed, cushion, seat, etc.) 0 INTERVENTIONS - Wound Cleansing / Measurement X - Wound Imaging (photographs - any number of wounds) 1 5 []  - Wound Tracing (instead of photographs) 0 X - Simple Wound Measurement - one wound 1 5 []  -  Complex Wound Measurement - multiple wounds 0 X - Simple Wound Cleansing - one wound 1 5 []  - Complex Wound Cleansing - multiple wounds 0 INTERVENTIONS - Wound Dressings []  - Small Wound Dressing one or multiple wounds 0 X - Medium Wound Dressing one or multiple wounds 2 15 []  - Large Wound Dressing one or multiple wounds 0 []  - Application of Medications - injection 0 INTERVENTIONS - Miscellaneous []  - External ear exam 0 []  - Specimen Collection (cultures, biopsies, blood, body fluids, etc.) 0 []  - Specimen(s) / Culture(s) sent or taken to Lab for analysis 0 []  - Patient Transfer (multiple staff / Civil Service fast streamer / Similar devices) 0 []  - Simple Staple / Suture removal (25 or less) 0 []  - Complex Staple / Suture removal (26 or more) 0 Hebb, Janoah C. ([id number]) []  - Hypo / Hyperglycemic Management (close monitor of Blood Glucose) 0 []  - Ankle / Brachial Index (ABI) - do not check if billed separately 0 Has the patient been seen at the hospital within the last three years: Yes Total Score: 135 Level Of Care: New/Established - Level 4 Electronic Signature(s) Signed: 04/06/2015 3:49:12 PM By: Gretta Cool, RN, BSN, Kim RN, BSN Entered By: Gretta Cool, RN, BSN, Kim on 04/06/2015 14:01:21 Udell, [name] ([id number]) -------------------------------------------------------------------------------- Complex / Palliative Patient Assessment Details Patient Name: [name] Date of Service: 04/06/2015 12:45 PM Medical Record Number: [id number] Patient Account Number: [id number] Date of Birth/Sex: 08-May-1924 (81 y.o. Male) Treating RN: Cornell Barman Primary Care Physician: Otilio Miu Other Clinician: Referring Physician: SELF,  REFERRED Treating Physician/Extender: Frann Rider in Treatment: 0 Palliative Management Criteria Patient has a terminal condition (life expectancy of less than 6 months) and advanced wound care would negatively impact the patient's quality of life. Will order supplies to make patient more comfortable with excessive drainage. Complex Wound Management Criteria Care Approach Wound Care Plan: Palliative Wound Management Electronic Signature(s) Signed: 04/06/2015 3:49:12 PM By: Gretta Cool RN, BSN, Kim RN, BSN Signed: 04/06/2015 4:13:31 PM By: Christin Fudge MD, FACS Entered By: Gretta Cool RN, BSN, Kim on 04/06/2015 15:19:01 Mccaster, [name] ([id number]) -------------------------------------------------------------------------------- Encounter Discharge Information Details Patient Name: WENCESLAUS, GRANDBERRY. Date of Service: 04/06/2015 12:45 PM Medical Record Number: [id number] Patient Account Number: [id number] Date of Birth/Sex: 09-27-1924 (81 y.o. Male) Treating RN: Cornell Barman Primary Care Physician: Otilio Miu Other Clinician: Referring Physician: SELF, REFERRED Treating Physician/Extender: Frann Rider in Treatment: 0 Encounter Discharge Information Items Discharge Pain Level: 0 Discharge Condition: Stable Ambulatory Status: Ambulatory Discharge Destination: Home Transportation: Private Auto Accompanied By: daughter Schedule Follow-up Appointment: Yes Medication Reconciliation completed and provided to Patient/Care Yes Shiasia Porro: Provided on Clinical Summary of Care: 04/06/2015 Form Type Recipient Paper Patient JS Electronic Signature(s) Signed: 04/06/2015 2:14:00 PM By: Ruthine Dose Entered By: Ruthine Dose on 04/06/2015 14:14:00 Larmon, [name] ([id number]) -------------------------------------------------------------------------------- Multi Wound Chart Details Patient Name: [name]. Date of Service: 04/06/2015 12:45 PM Medical Record Number:  [id number] Patient Account Number: [id number] Date of Birth/Sex: 1924-08-20 (81 y.o. Male) Treating RN: Cornell Barman Primary Care Physician: Otilio Miu Other Clinician: Referring Physician: SELF, REFERRED Treating Physician/Extender: Frann Rider in Treatment: 0 Vital Signs Height(in): 72 Pulse(bpm): 107 Weight(lbs): 141 Blood Pressure 114/67 (mmHg): Body Mass Index(BMI): 19 Temperature(F): 97.5 Respiratory Rate 20 (breaths/min): Photos: [1:No Photos] [2:No Photos] [N/A:N/A] Wound Location: [1:Right Neck] [2:Right Ear] [N/A:N/A] Wounding Event: [1:Gradually Appeared] [2:Gradually Appeared] [N/A:N/A] Primary Etiology: [1:Malignant Wound] [2:Malignant Wound] [N/A:N/A] Comorbid History: [1:Cataracts, Anemia, Chronic Obstructive Pulmonary Disease (COPD), Hypertension, Rheumatoid Arthritis, Received  Chemotherapy, Received Chemotherapy, Received Radiation] [2:Cataracts, Anemia, Chronic Obstructive Pulmonary Disease  (COPD), Hypertension, Rheumatoid Arthritis, Received Radiation] [N/A:N/A] Date Acquired: [1:02/12/2015] [2:03/12/2015] [N/A:N/A] Weeks of Treatment: [1:0] [2:0] [N/A:N/A] Wound Status: [1:Open] [2:Open] [N/A:N/A] Measurements L x W x D 3x2.8x1.5 [2:2x1.5x0.1] [N/A:N/A] (cm) Area (cm) : [1:6.597] [2:2.356] [N/A:N/A] Volume (cm) : [1:9.896] [2:0.236] [N/A:N/A] Classification: [1:Full Thickness Without Exposed Support Structures] [2:Partial Thickness] [N/A:N/A] Exudate Amount: [1:Large] [2:Small] [N/A:N/A] Exudate Type: [1:Serous] [2:Serous] [N/A:N/A] Exudate Color: [1:amber] [2:amber] [N/A:N/A] Wound Margin: [1:Well defined, not attached Indistinct, nonvisible] [N/A:N/A] Granulation Amount: [1:None Present (0%)] [2:None Present (0%)] [N/A:N/A] Necrotic Amount: [1:Large (67-100%)] [2:Large (67-100%)] [N/A:N/A] Necrotic Tissue: [1:Adherent Slough] [2:Eschar] [N/A:N/A] Exposed Structures: [1:Fascia: No Fat: No] [2:Fascia: No Fat: No] [N/A:N/A] Tendon: No Tendon:  No Muscle: No Muscle: No Joint: No Joint: No Bone: No Bone: No Limited to Skin Limited to Skin Breakdown Breakdown Periwound Skin Texture: Edema: No Scarring: Yes N/A Excoriation: No Edema: No Induration: No Excoriation: No Callus: No Induration: No Crepitus: No Callus: No Fluctuance: No Crepitus: No Friable: No Fluctuance: No Rash: No Friable: No Scarring: No Rash: No Periwound Skin Maceration: Yes Maceration: No N/A Moisture: Moist: Yes Moist: No Dry/Scaly: No Dry/Scaly: No Periwound Skin Color: Atrophie Blanche: No Atrophie Blanche: No N/A Cyanosis: No Cyanosis: No Ecchymosis: No Ecchymosis: No Erythema: No Erythema: No Hemosiderin Staining: No Hemosiderin Staining: No Mottled: No Mottled: No Pallor: No Pallor: No Rubor: No Rubor: No Temperature: No Abnormality N/A N/A Tenderness on Yes No N/A Palpation: Wound Preparation: Ulcer Cleansing: Ulcer Cleansing: N/A Rinsed/Irrigated with Rinsed/Irrigated with Saline Saline Topical Anesthetic Topical Anesthetic Applied: Other: lidociane Applied: Other: liodocaine 4% 4% Treatment Notes Electronic Signature(s) Signed: 04/06/2015 3:49:12 PM By: Gretta Cool, RN, BSN, Kim RN, BSN Entered By: Gretta Cool, RN, BSN, Kim on 04/06/2015 13:54:45 Saulter, [name] ([id number]) -------------------------------------------------------------------------------- Multi-Disciplinary Care Plan Details Patient Name: [name], [name]. Date of Service: 04/06/2015 12:45 PM Medical Record Number: [id number] Patient Account Number: [id number] Date of Birth/Sex: 1924-05-28 (81 y.o. Male) Treating RN: Cornell Barman Primary Care Physician: Otilio Miu Other Clinician: Referring Physician: SELF, REFERRED Treating Physician/Extender: Frann Rider in Treatment: 0 Active Inactive Malignancy/Atypical Etiology Nursing Diagnoses: Knowledge deficit related to disease process and management of atypical ulcer  etiology Goals: Patient/caregiver will verbalize understanding of disease process and disease management of atypical ulcer etiology Date Initiated: 04/06/2015 Goal Status: Active Interventions: Assess patient and family medical history for signs and symptoms of malignancy/atypical etiology upon admission Notes: Necrotic Tissue Nursing Diagnoses: Impaired tissue integrity related to necrotic/devitalized tissue Goals: Necrotic/devitalized tissue will be minimized in the wound bed Date Initiated: 04/06/2015 Goal Status: Active Interventions: Assess patient pain level pre-, during and post procedure and prior to discharge Notes: Orientation to the Wound Care Program Nursing Diagnoses: Knowledge deficit related to the wound healing center program Goals: BIRD, LAMARQUE ([id number]) Patient/caregiver will verbalize understanding of the [facility] Program Date Initiated: 04/06/2015 Goal Status: Active Interventions: Provide education on orientation to the wound center Notes: Wound/Skin Impairment Nursing Diagnoses: Impaired tissue integrity Goals: Patient/caregiver will verbalize understanding of skin care regimen Date Initiated: 04/06/2015 Goal Status: Active Interventions: Assess patient/caregiver ability to perform ulcer/skin care regimen upon admission and as needed Notes: Electronic Signature(s) Signed: 04/06/2015 3:49:12 PM By: Gretta Cool, RN, BSN, Kim RN, BSN Entered By: Gretta Cool, RN, BSN, Kim on 04/06/2015 13:54:32 Doom, [name] ([id number]) -------------------------------------------------------------------------------- Pain Assessment Details Patient Name: [name]. Date of Service: 04/06/2015 12:45 PM Medical Record Number: [id number] Patient Account Number: [id number] Date of Birth/Sex: 1924-07-20 (  81 y.o. Male) Treating RN: Cornell Barman Primary Care Physician: Otilio Miu Other Clinician: Referring Physician: SELF, REFERRED Treating  Physician/Extender: Frann Rider in Treatment: 0 Active Problems Location of Pain Severity and Description of Pain Patient Has Paino No Site Locations Pain Management and Medication Current Pain Management: Electronic Signature(s) Signed: 04/06/2015 3:49:12 PM By: Gretta Cool, RN, BSN, Kim RN, BSN Entered By: Gretta Cool, RN, BSN, Kim on 04/06/2015 14:04:41 Kniss, [name] ([id number]) -------------------------------------------------------------------------------- Patient/Caregiver Education Details Patient Name: [name] Date of Service: 04/06/2015 12:45 PM Medical Record Number: [id number] Patient Account Number: [id number] Date of Birth/Gender: 1925-04-14 (81 y.o. Male) Treating RN: Cornell Barman Primary Care Physician: Otilio Miu Other Clinician: Referring Physician: SELF, REFERRED Treating Physician/Extender: Frann Rider in Treatment: 0 Education Assessment Education Provided To: Patient Education Topics Provided Welcome To [facility]: Handouts: Welcome To [facility] Methods: Demonstration, Explain/Verbal Responses: State content correctly Wound/Skin Impairment: Handouts: Caring for Your Ulcer Methods: Demonstration, Explain/Verbal Responses: State content correctly Electronic Signature(s) Signed: 04/06/2015 3:49:12 PM By: Gretta Cool, RN, BSN, Kim RN, BSN Entered By: Gretta Cool, RN, BSN, Kim on 04/06/2015 14:04:17 Marsch, [name] ([id number]) -------------------------------------------------------------------------------- Wound Assessment Details Patient Name: CHING, [name]. Date of Service: 04/06/2015 12:45 PM Medical Record Number: [id number] Patient Account Number: [id number] Date of Birth/Sex: 02-25-1925 (81 y.o. Male) Treating RN: Cornell Barman Primary Care Physician: Otilio Miu Other Clinician: Referring Physician: SELF, REFERRED Treating Physician/Extender: Frann Rider in Treatment: 0 Wound Status Wound Number: 1 Primary  Malignant Wound Etiology: Wound Location: Right Neck Wound Open Wounding Event: Gradually Appeared Status: Date Acquired: 02/12/2015 Comorbid Cataracts, Anemia, Chronic Obstructive Weeks Of Treatment: 0 History: Pulmonary Disease (COPD), Clustered Wound: No Hypertension, Rheumatoid Arthritis, Received Chemotherapy, Received Radiation Photos Photo Uploaded By: Gretta Cool, RN, BSN, Kim on 04/06/2015 17:08:14 Wound Measurements Length: (cm) 3 % Reduction i Width: (cm) 2.8 % Reduction i Depth: (cm) 1.5 Area: (cm) 6.597 Volume: (cm) 9.896 n Area: n Volume: Wound Description Full Thickness Without Exposed Classification: Support Structures RAYQUON, RUMPH ([id number]) Foul Odor After Cleansing: No Wound Margin: Well defined, not attached Exudate Large Amount: Exudate Type: Serous Exudate Color: amber Wound Bed Granulation Amount: None Present (0%) Exposed Structure Necrotic Amount: Large (67-100%) Fascia Exposed: No Necrotic Quality: Adherent Slough Fat Layer Exposed: No Tendon Exposed: No Muscle Exposed: No Joint Exposed: No Bone Exposed: No Limited to Skin Breakdown Periwound Skin Texture Texture Color No Abnormalities Noted: No No Abnormalities Noted: No Callus: No Atrophie Blanche: No Crepitus: No Cyanosis: No Excoriation: No Ecchymosis: No Fluctuance: No Erythema: No Friable: No Hemosiderin Staining: No Induration: No Mottled: No Localized Edema: No Pallor: No Rash: No Rubor: No Scarring: No Temperature / Pain Moisture Temperature: No Abnormality No Abnormalities Noted: No Tenderness on Palpation: Yes Dry / Scaly: No Maceration: Yes Moist: Yes Wound Preparation Ulcer Cleansing: Rinsed/Irrigated with Saline Topical Anesthetic Applied: Other: lidociane 4%, Treatment Notes Wound #1 (Right Neck) 1. Cleansed with: Clean wound with Normal Saline 2. Anesthetic Topical Lidocaine 4% cream to wound bed prior to debridement 4. Dressing  Applied: Aquacel Ag MACLOVIO, EXLINE C. ([id number]) 5. Secondary Dressing Applied Dry Gauze Non-Adherent pad 7. Secured with Recruitment consultant) Signed: 04/06/2015 3:49:12 PM By: Gretta Cool, RN, BSN, Kim RN, BSN Entered By: Gretta Cool, RN, BSN, Kim on 04/06/2015 13:30:53 Flenner, [name] ([id number]) -------------------------------------------------------------------------------- Wound Assessment Details Patient Name: [name], [name]. Date of Service: 04/06/2015 12:45 PM Medical Record Number: [id number] Patient Account Number: [id number] Date of Birth/Sex: Jan 02, 1925 (81 y.o. Male) Treating  RN: Cornell Barman Primary Care Physician: Otilio Miu Other Clinician: Referring Physician: SELF, REFERRED Treating Physician/Extender: Frann Rider in Treatment: 0 Wound Status Wound Number: 2 Primary Malignant Wound Etiology: Wound Location: Right Ear Wound Open Wounding Event: Gradually Appeared Status: Date Acquired: 03/12/2015 Comorbid Cataracts, Anemia, Chronic Obstructive Weeks Of Treatment: 0 History: Pulmonary Disease (COPD), Clustered Wound: No Hypertension, Rheumatoid Arthritis, Received Chemotherapy, Received Radiation Photos Wound Measurements Length: (cm) 2 Width: (cm) 1.5 Depth: (cm) 0.1 Area: (cm) 2.356 Volume: (cm) 0.236 % Reduction in Area: 0% % Reduction in Volume: 0% Wound Description Classification: Partial Thickness Wound Margin: Indistinct, nonvisible Exudate Amount: Medium Exudate Type: Serous Exudate Color: amber Wound Bed Granulation Amount: None Present (0%) Exposed Structure Necrotic Amount: Large (67-100%) Fascia Exposed: No Necrotic Quality: Eschar Fat Layer Exposed: No Garlock, Caydence C. ([id number]) Tendon Exposed: No Muscle Exposed: No Joint Exposed: No Bone Exposed: No Limited to Skin Breakdown Periwound Skin Texture Texture Color No Abnormalities Noted: No No Abnormalities Noted: No Callus: No Atrophie Blanche:  No Crepitus: No Cyanosis: No Excoriation: No Ecchymosis: No Fluctuance: No Erythema: No Friable: No Hemosiderin Staining: No Induration: No Mottled: No Localized Edema: No Pallor: No Rash: No Rubor: No Scarring: Yes Moisture No Abnormalities Noted: No Dry / Scaly: No Maceration: No Moist: No Wound Preparation Ulcer Cleansing: Rinsed/Irrigated with Saline Topical Anesthetic Applied: Other: liodocaine 4%, Treatment Notes Wound #2 (Right Ear) 1. Cleansed with: Clean wound with Normal Saline 2. Anesthetic Topical Lidocaine 4% cream to wound bed prior to debridement 4. Dressing Applied: Aquacel Ag 5. Secondary Dressing Applied Dry Gauze Non-Adherent pad 7. Secured with Recruitment consultant) Signed: 04/09/2015 6:01:17 PM By: Gretta Cool, RN, BSN, Kim RN, BSN Previous Signature: 04/06/2015 3:49:12 PM Version By: Gretta Cool, RN, BSN, Kim RN, BSN Entered By: Gretta Cool, RN, BSN, Kim on 04/06/2015 17:17:47 Curley, [name] ([id number]) BENTLIE, MANSKER ([id number]) -------------------------------------------------------------------------------- [place] Details Patient Name: SPIRIDON, GAUDREAULT. Date of Service: 04/06/2015 12:45 PM Medical Record Number: [id number] Patient Account Number: [id number] Date of Birth/Sex: 1925/04/02 (81 y.o. Male) Treating RN: Cornell Barman Primary Care Physician: Otilio Miu Other Clinician: Referring Physician: SELF, REFERRED Treating Physician/Extender: Frann Rider in Treatment: 0 Vital Signs Time Taken: 13:06 Temperature (F): 97.5 Height (in): 72 Pulse (bpm): 107 Weight (lbs): 141 Respiratory Rate (breaths/min): 20 Body Mass Index (BMI): 19.1 Blood Pressure (mmHg): 114/67 Reference Range: 80 - 120 mg / dl Electronic Signature(s) Signed: 04/06/2015 3:49:12 PM By: Gretta Cool, RN, BSN, Kim RN, BSN Entered By: Gretta Cool, RN, BSN, Kim on 04/06/2015 [id number]

## 2015-04-07 NOTE — Progress Notes (Signed)
BRISCOE, CARBIN ([id number]) Visit Report for 04/06/2015 Abuse/Suicide Risk Screen Details Patient Name: [name], [name]. Date of Service: 04/06/2015 12:45 PM Medical Record Number: [id number] Patient Account Number: [id number] Date of Birth/Sex: November 13, 1924 (81 y.o. Male) Treating RN: Cornell Barman Primary Care Physician: Otilio Miu Other Clinician: Referring Physician: SELF, REFERRED Treating Physician/Extender: Frann Rider in Treatment: 0 Abuse/Suicide Risk Screen Items Answer ABUSE/SUICIDE RISK SCREEN: Has anyone close to you tried to hurt or harm you recentlyo No Do you feel uncomfortable with anyone in your familyo No Has anyone forced you do things that you didnot want to doo No Do you have any thoughts of harming yourselfo No Patient displays signs or symptoms of abuse and/or neglect. No Electronic Signature(s) Signed: 04/06/2015 3:49:12 PM By: Gretta Cool, RN, BSN, Kim RN, BSN Entered By: Gretta Cool, RN, BSN, Kim on 04/06/2015 13:22:18 Ruberg, [name] ([id number]) -------------------------------------------------------------------------------- Activities of Daily Living Details Patient Name: Tyrone, Barton. Date of Service: 04/06/2015 12:45 PM Medical Record Number: [id number] Patient Account Number: [id number] Date of Birth/Sex: [birth year] (81 y.o. Male) Treating RN: Cornell Barman Primary Care Physician: Otilio Miu Other Clinician: Referring Physician: SELF, REFERRED Treating Physician/Extender: Frann Rider in Treatment: 0 Activities of Daily Living Items Answer Activities of Daily Living (Please select one for each item) Drive Automobile Completely Able Take Medications Completely Able Use Telephone Completely Able Care for Appearance Completely Able Use Toilet Completely Able Bath / Shower Completely Able Dress Self Completely Able Feed Self Completely Able Walk Completely Able Get In / Out Bed Completely Able Housework Completely Able Prepare Meals  Completely Able Handle Money Completely Able Shop for Self Completely Able Electronic Signature(s) Signed: 04/06/2015 3:49:12 PM By: Gretta Cool, RN, BSN, Kim RN, BSN Entered By: Gretta Cool, RN, BSN, Kim on 04/06/2015 13:22:32 Gainer, [name] ([id number]) -------------------------------------------------------------------------------- Education Assessment Details Patient Name: [name]. Date of Service: 04/06/2015 12:45 PM Medical Record Number: [id number] Patient Account Number: [id number] Date of Birth/Sex: 1924-09-02 (81 y.o. Male) Treating RN: Cornell Barman Primary Care Physician: Otilio Miu Other Clinician: Referring Physician: SELF, REFERRED Treating Physician/Extender: Frann Rider in Treatment: 0 Primary Learner Assessed: Patient Learning Preferences/Education Level/Primary Language Learning Preference: Explanation, Demonstration Preferred Language: English Cognitive Barrier Assessment/Beliefs Language Barrier: No Translator Needed: No Memory Deficit: No Emotional Barrier: No Cultural/Religious Beliefs Affecting Medical No Care: Physical Barrier Assessment Impaired Vision: No Impaired Hearing: Yes Decreased Hand dexterity: No Knowledge/Comprehension Assessment Knowledge Level: Medium Comprehension Level: Medium Ability to understand written Medium instructions: Ability to understand verbal Medium instructions: Motivation Assessment Anxiety Level: Calm Cooperation: Cooperative Education Importance: Acknowledges Need Interest in Health Problems: Asks Questions Perception: Coherent Willingness to Engage in Self- High Management Activities: Readiness to Engage in Self- High Management Activities: Electronic Signature(s) Signed: 04/06/2015 3:49:12 PM By: Gretta Cool, RN, BSN, Kim RN, BSN [place]in[place], [place] ([id number]) Entered By: Gretta Cool, RN, BSN, Kim on 04/06/2015 13:23:08 NIZAIAH, SCHOEPP  ([id number]) -------------------------------------------------------------------------------- Fall Risk Assessment Details Patient Name: [name]. Date of Service: 04/06/2015 12:45 PM Medical Record Number: [id number] Patient Account Number: [id number] Date of Birth/Sex: January 05, 1925 (81 y.o. Male) Treating RN: Cornell Barman Primary Care Physician: Otilio Miu Other Clinician: Referring Physician: SELF, REFERRED Treating Physician/Extender: Frann Rider in Treatment: 0 Fall Risk Assessment Items Have you had 2 or more falls in the last 12 monthso 0 No Have you had any fall that resulted in injury in the last 12 monthso 0 No FALL RISK ASSESSMENT: History of falling - immediate or within 3 months 0 No Secondary diagnosis  0 No Ambulatory aid None/bed rest/wheelchair/nurse 0 Yes Crutches/cane/walker 0 No Furniture 0 No IV Access/Saline Lock 0 No Gait/Training Normal/bed rest/immobile 0 Yes Weak 0 No Impaired 0 No Mental Status Oriented to own ability 0 Yes Electronic Signature(s) Signed: 04/06/2015 3:49:12 PM By: Gretta Cool, RN, BSN, Kim RN, BSN Entered By: Gretta Cool, RN, BSN, Kim on 04/06/2015 13:23:23 Strickling, [name] ([id number]) -------------------------------------------------------------------------------- Nutrition Risk Assessment Details Patient Name: Tyrone Bales C. Date of Service: 04/06/2015 12:45 PM Medical Record Number: [id number] Patient Account Number: [id number] Date of Birth/Sex: Dec 14, 1924 (81 y.o. Male) Treating RN: Cornell Barman Primary Care Physician: Otilio Miu Other Clinician: Referring Physician: SELF, REFERRED Treating Physician/Extender: Frann Rider in Treatment: 0 Height (in): 72 Weight (lbs): 141 Body Mass Index (BMI): 19.1 Nutrition Risk Assessment Items NUTRITION RISK SCREEN: I have an illness or condition that made me change the kind and/or 0 No amount of food I eat I eat fewer than two meals per day 3 Yes I eat few fruits and  vegetables, or milk products 0 No I have three or more drinks of beer, liquor or wine almost every day 0 No I have tooth or mouth problems that make it hard for me to eat 0 No I don't always have enough money to buy the food I need 0 No I eat alone most of the time 0 No I take three or more different prescribed or over-the-counter drugs a 0 No day Without wanting to, I have lost or gained 10 pounds in the last six 0 No months I am not always physically able to shop, cook and/or feed myself 0 No Nutrition Protocols Good Risk Protocol Provide education on Moderate Risk Protocol 0 nutrition Electronic Signature(s) Signed: 04/06/2015 3:49:12 PM By: Gretta Cool, RN, BSN, Kim RN, BSN Entered By: Gretta Cool, RN, BSN, Kim on 04/06/2015 13:23:41

## 2015-04-21 ENCOUNTER — Emergency Department
Admission: EM | Admit: 2015-04-21 | Discharge: 2015-04-21 | Disposition: A | Discharge status: Home / Self Care | Payer: Medicare Other | Attending: Emergency Medicine | Admitting: Emergency Medicine

## 2015-04-21 ENCOUNTER — Encounter: Payer: Self-pay | Admitting: Emergency Medicine

## 2015-04-21 ENCOUNTER — Emergency Department: Payer: Medicare Other

## 2015-04-21 DIAGNOSIS — Y998 Other external cause status: Secondary | ICD-10-CM | POA: Insufficient documentation

## 2015-04-21 DIAGNOSIS — Z880 Allergy status to penicillin: Secondary | ICD-10-CM | POA: Insufficient documentation

## 2015-04-21 DIAGNOSIS — Z87891 Personal history of nicotine dependence: Secondary | ICD-10-CM | POA: Diagnosis not present

## 2015-04-21 DIAGNOSIS — Z7951 Long term (current) use of inhaled steroids: Secondary | ICD-10-CM | POA: Insufficient documentation

## 2015-04-21 DIAGNOSIS — S40011A Contusion of right shoulder, initial encounter: Secondary | ICD-10-CM | POA: Diagnosis not present

## 2015-04-21 DIAGNOSIS — Y9289 Other specified places as the place of occurrence of the external cause: Secondary | ICD-10-CM | POA: Insufficient documentation

## 2015-04-21 DIAGNOSIS — W19XXXA Unspecified fall, initial encounter: Secondary | ICD-10-CM

## 2015-04-21 DIAGNOSIS — W01198A Fall on same level from slipping, tripping and stumbling with subsequent striking against other object, initial encounter: Secondary | ICD-10-CM | POA: Insufficient documentation

## 2015-04-21 DIAGNOSIS — I10 Essential (primary) hypertension: Secondary | ICD-10-CM | POA: Diagnosis not present

## 2015-04-21 DIAGNOSIS — S20211A Contusion of right front wall of thorax, initial encounter: Secondary | ICD-10-CM | POA: Insufficient documentation

## 2015-04-21 DIAGNOSIS — S0990XA Unspecified injury of head, initial encounter: Secondary | ICD-10-CM | POA: Diagnosis not present

## 2015-04-21 DIAGNOSIS — S4991XA Unspecified injury of right shoulder and upper arm, initial encounter: Secondary | ICD-10-CM | POA: Diagnosis present

## 2015-04-21 DIAGNOSIS — Y9389 Activity, other specified: Secondary | ICD-10-CM | POA: Insufficient documentation

## 2015-04-21 DIAGNOSIS — Z79899 Other long term (current) drug therapy: Secondary | ICD-10-CM | POA: Diagnosis not present

## 2015-04-21 MED ORDER — ACETAMINOPHEN-CODEINE #3 300-30 MG PO TABS: 1.0000 | ORAL_TABLET | Freq: Four times a day (QID) | ORAL | Status: DC | PRN

## 2015-04-21 MED ORDER — OXYCODONE-ACETAMINOPHEN 5-325 MG PO TABS
1.0000 | ORAL_TABLET | Freq: Once | ORAL | Status: AC
Start: 2015-04-21 — End: 2015-04-21
  Administered 2015-04-21: 1 via ORAL
  Filled 2015-04-21: qty 1

## 2015-04-21 NOTE — ED Provider Notes (Addendum)
[facility] Emergency Department Provider Note  ____________________________________________  Time seen: Approximately 1110 AM  I have reviewed the triage vital signs and the nursing notes.   HISTORY  Chief Complaint Shoulder Pain    HPI [name] is a 81 y.o. male with a history of right sided facial and neck skin cancer who is presenting today after a fall last night. He says that he thought he heard someone knocking at his door and then got up and tripped over his feet. He said that he fell onto his right side onto his right shoulder and the right side of his head. He says that the thing that is bothering him most at this time is his right shoulder where he has limited range of motion secondary to the pain. He has been able to walk since the fall and is not complaining of any headache at this time. He does not take any blood thinners. He is not complaining of any pelvic or hip pain. Denies any neck pain as well.Also complaining of "soreness" to the right side of his chest where he fell.   Past Medical History  Diagnosis Date  . GERD (gastroesophageal reflux disease)   . Hypertension   . COPD (chronic obstructive pulmonary disease) [facility])     Patient Active Problem List   Diagnosis Date Noted  . Essential hypertension 01/09/2015  . Esophageal reflux 01/09/2015  . Centrilobular emphysema ([place]) 01/09/2015    Past Surgical History  Procedure Laterality Date  . Mohs surgery      Current Outpatient Rx  Name  Route  Sig  Dispense  Refill  . budesonide-formoterol (SYMBICORT) 160-4.5 MCG/ACT inhaler   Inhalation   Inhale 2 puffs into the lungs 2 (two) times daily.   1 Inhaler   6   . lisinopril-hydrochlorothiazide (PRINZIDE,ZESTORETIC) 20-25 MG per tablet   Oral   Take 1 tablet by mouth daily.   30 tablet   5   . ranitidine (ZANTAC) 150 MG tablet   Oral   Take 1 tablet (150 mg total) by mouth daily.   30 tablet   5   . tiotropium  (SPIRIVA HANDIHALER) 18 MCG inhalation capsule   Inhalation   Place 1 capsule (18 mcg total) into inhaler and inhale daily at 6 (six) AM.   30 capsule   5     Allergies Albuterol and Penicillins  No family history on file.  Social History Social History  Substance Use Topics  . Smoking status: Former Research scientist (life sciences)  . Smokeless tobacco: Current User    Types: Chew  . Alcohol Use: No    Review of Systems Constitutional: No fever/chills Eyes: No visual changes. ENT: No sore throat. Cardiovascular: Denies chest pain. Respiratory: Denies shortness of breath. Gastrointestinal: No abdominal pain.  No nausea, no vomiting.  No diarrhea.  No constipation. Genitourinary: Negative for dysuria. Musculoskeletal: Negative for back pain. Skin: Negative for rash. Neurological: Negative for headaches, focal weakness or numbness.  10-point ROS otherwise negative.  ____________________________________________   PHYSICAL EXAM:  VITAL SIGNS: ED Triage Vitals  Enc Vitals Group     BP 04/21/15 1055 116/68 mmHg     Pulse Rate 04/21/15 1055 105     Resp 04/21/15 1055 18     Temp 04/21/15 1055 98.6 F (37 C)     Temp Source 04/21/15 1055 Oral     SpO2 04/21/15 1055 97 %     Weight 04/21/15 1055 141 lb (63.957 kg)  Height 04/21/15 1055 6' (1.829 m)     Head Cir --      Peak Flow --      Pain Score --      Pain Loc --      Pain Edu? --      Excl. in [place]? --     Constitutional: Alert and oriented. Well appearing and in no acute distress. Eyes: Conjunctivae are normal. PERRL. EOMI. Head: Atraumatic. Nose: No congestion/rhinnorhea. Mouth/Throat: Mucous membranes are moist.  Oropharynx non-erythematous. Neck: No stridor.  No tenderness to the midline C-spine. Ranges the head and neck freely without any signs of pain. Cardiovascular: Normal rate, regular rhythm. Grossly normal heart sounds.  Good peripheral circulation. Respiratory: Normal respiratory effort.  No retractions. Lungs  CTAB. Gastrointestinal: Soft and nontender. No distention. No abdominal bruits. No CVA tenderness. Musculoskeletal: No lower extremity tenderness nor edema.  No joint effusions. Ranges hips bilaterally to a full range of motion without any pain or limitation. Pelvis is stable and nontender. Spine palpated from the cervical spine through the lumbar spine without any tenderness or step-off. Mild amount of right-sided rib tenderness without any overlying ecchymosis or deformity or crepitus. Right shoulder with limited range of motion to abduction with pain to the proximal humerus laterally. There is tenderness to palpation there as well. There is no step-off. The patient is able to range the shoulder but he has limited secondary to pain. Neurovascularly intact distal to the right shoulder without any elbow tenderness or restricted range of motion. Neurologic:  Normal speech and language. No gross focal neurologic deficits are appreciated. No gait instability. Skin:  Skin is warm, dry. Right side of the neck just inferior to the right mandibular angle with a 3 x 4 cm large, ulcerated lesion secondary to the patient's skin cancer. There is no active bleeding. Psychiatric: Mood and affect are normal. Speech and behavior are normal.  ____________________________________________   LABS (all labs ordered are listed, but only abnormal results are displayed)  Labs Reviewed - No data to display ____________________________________________  EKG   ____________________________________________  RADIOLOGY  No acute findings on the CAT scan of the brain, right shoulder film or right rib series. ____________________________________________   PROCEDURES   ____________________________________________   INITIAL IMPRESSION / ASSESSMENT AND PLAN / ED COURSE  Pertinent labs & imaging results that were available during my care of the patient were reviewed by me and considered in my medical decision making  (see chart for details).  Lesion to the right side of the neck without any change per the patient's daughter who is at the bedside. She says that lesion is chronic and the patient has stopped his cancer treatment secondary to side effects. He has subsequently been discharged from his dermatologist office.  ----------------------------------------- 12:41 PM on 04/21/2015 -----------------------------------------  Patient resting comfortably at this time and has increased range of motion to the right shoulder status post Percocet. The patient is family are asking for pain medication for home use. He usually uses Vicodin at home. I told him that usually we do not write her prescription medications for pain control for injuries without radiographic evidence of fracture. The patient and family are persistent saying that these medications available during that works for this patient. He denies getting drowsy having falls on medication. I told him of the vesica do her several doses of Tylenol 3. They're agreeable to this. The patient will be discharged home. I will not put the patient is feeling is he is  able to range his arm and I feel that moving it would benefit his healing process. We also discussed ice over the next 2 days and then heat thereafter. ____________________________________________   FINAL CLINICAL IMPRESSION(S) / ED DIAGNOSES  Right shoulder and rib contusion. Mechanical fall.   Orbie Pyo, MD 04/21/15 1242  Orbie Pyo, MD 04/21/15 [phone]

## 2015-04-21 NOTE — ED Notes (Signed)
Pt states he tripped, when he fell, right ribs hurting as well.

## 2015-04-21 NOTE — ED Notes (Signed)
Pt here after a fall last night, c/o right shoulder pain, no obvious dislocation noted. Daughter concerned about skin cancer to right side of face as well.

## 2015-04-21 NOTE — ED Notes (Signed)
Patient transported to X-ray 

## 2015-04-21 NOTE — Discharge Instructions (Signed)
Blunt Chest Trauma °Blunt chest trauma is an injury caused by a blow to the chest. These chest injuries can be very painful. Blunt chest trauma often results in bruised or broken (fractured) ribs. Most cases of bruised and fractured ribs from blunt chest traumas get better after 1 to 3 weeks of rest and pain medicine. Often, the soft tissue in the chest wall is also injured, causing pain and bruising. Internal organs, such as the heart and lungs, may also be injured. Blunt chest trauma can lead to serious medical problems. This injury requires immediate medical care. °CAUSES  °· Motor vehicle collisions. °· Falls. °· Physical violence. °· Sports injuries. °SYMPTOMS  °· Chest pain. The pain may be worse when you move or breathe deeply. °· Shortness of breath. °· Lightheadedness. °· Bruising. °· Tenderness. °· Swelling. °DIAGNOSIS  °Your caregiver will do a physical exam. X-rays may be taken to look for fractures. However, minor rib fractures may not show up on X-rays until a few days after the injury. If a more serious injury is suspected, further imaging tests may be done. This may include ultrasounds, computed tomography (CT) scans, or magnetic resonance imaging (MRI). °TREATMENT  °Treatment depends on the severity of your injury. Your caregiver may prescribe pain medicines and deep breathing exercises. °HOME CARE INSTRUCTIONS °· Limit your activities until you can move around without much pain. °· Do not do any strenuous work until your injury is healed. °· Put ice on the injured area. °¨ Put ice in a plastic bag. °¨ Place a towel between your skin and the bag. °¨ Leave the ice on for 15-20 minutes, 03-04 times a day. °· You may wear a rib belt as directed by your caregiver to reduce pain. °· Practice deep breathing as directed by your caregiver to keep your lungs clear. °· Only take over-the-counter or prescription medicines for pain, fever, or discomfort as directed by your caregiver. °SEEK IMMEDIATE MEDICAL  CARE IF:  °· You have increasing pain or shortness of breath. °· You cough up blood. °· You have nausea, vomiting, or abdominal pain. °· You have a fever. °· You feel dizzy, weak, or you faint. °MAKE SURE YOU: °· Understand these instructions. °· Will watch your condition. °· Will get help right away if you are not doing well or get worse. °  °This information is not intended to replace advice given to you by your health care provider. Make sure you discuss any questions you have with your health care provider. °  °Document Released: 05/22/2004 Document Revised: 05/05/2014 Document Reviewed: 10/11/2014 °[employer] Interactive Patient Education ©2016 [employer] ° °

## 2015-05-04 ENCOUNTER — Encounter: Payer: Medicare Other | Attending: Surgery | Admitting: Surgery

## 2015-05-04 DIAGNOSIS — L98492 Non-pressure chronic ulcer of skin of other sites with fat layer exposed: Secondary | ICD-10-CM | POA: Diagnosis not present

## 2015-05-04 DIAGNOSIS — C4440 Unspecified malignant neoplasm of skin of scalp and neck: Secondary | ICD-10-CM | POA: Insufficient documentation

## 2015-05-04 DIAGNOSIS — Z85828 Personal history of other malignant neoplasm of skin: Secondary | ICD-10-CM | POA: Diagnosis not present

## 2015-05-04 DIAGNOSIS — C44319 Basal cell carcinoma of skin of other parts of face: Secondary | ICD-10-CM | POA: Insufficient documentation

## 2015-05-05 NOTE — Progress Notes (Signed)
DEVUN, Barton ([id number]) Visit Report for 05/04/2015 Arrival Information Details Patient Name: [name], [name]. Date of Service: 05/04/2015 12:45 PM Medical Record Number: [id number] Patient Account Number: [id number] Date of Birth/Sex: 18-Jul-1924 (82 y.o. Male) Treating RN: Montey Hora Primary Care Physician: Otilio Miu Other Clinician: Referring Physician: Otilio Miu Treating Physician/Extender: Frann Rider in Treatment: 4 Visit Information History Since Last Visit Added or deleted any medications: No Patient Arrived: Ambulatory Any new allergies or adverse reactions: No Arrival Time: 13:01 Had a fall or experienced change in No Accompanied By: dtr activities of daily living that may affect Transfer Assistance: None risk of falls: Patient Identification Verified: Yes Signs or symptoms of abuse/neglect since last No Secondary Verification Process Yes visito Completed: Hospitalized since last visit: No Patient Has Alerts: Yes Pain Present Now: Yes Patient Alerts: Patient on Blood Thinner Electronic Signature(s) Signed: 05/04/2015 5:42:17 PM By: Montey Hora Entered By: Montey Hora on 05/04/2015 13:02:02 [name] ([id number]) -------------------------------------------------------------------------------- Clinic Level of Care Assessment Details Patient Name: [name]. Date of Service: 05/04/2015 12:45 PM Medical Record Number: [id number] Patient Account Number: [id number] Date of Birth/Sex: December 26, 1924 (82 y.o. Male) Treating RN: Montey Hora Primary Care Physician: Otilio Miu Other Clinician: Referring Physician: Otilio Miu Treating Physician/Extender: Frann Rider in Treatment: 4 Clinic Level of Care Assessment Items TOOL 4 Quantity Score []  - Use when only an EandM is performed on FOLLOW-UP visit 0 ASSESSMENTS - Nursing Assessment / Reassessment X - Reassessment of Co-morbidities (includes updates in patient status) 1  10 X - Reassessment of Adherence to Treatment Plan 1 5 ASSESSMENTS - Wound and Skin Assessment / Reassessment []  - Simple Wound Assessment / Reassessment - one wound 0 X - Complex Wound Assessment / Reassessment - multiple wounds 2 5 []  - Dermatologic / Skin Assessment (not related to wound area) 0 ASSESSMENTS - Focused Assessment []  - Circumferential Edema Measurements - multi extremities 0 []  - Nutritional Assessment / Counseling / Intervention 0 []  - Lower Extremity Assessment (monofilament, tuning fork, pulses) 0 []  - Peripheral Arterial Disease Assessment (using hand held doppler) 0 ASSESSMENTS - Ostomy and/or Continence Assessment and Care []  - Incontinence Assessment and Management 0 []  - Ostomy Care Assessment and Management (repouching, etc.) 0 PROCESS - Coordination of Care X - Simple Patient / Family Education for ongoing care 1 15 []  - Complex (extensive) Patient / Family Education for ongoing care 0 []  - Staff obtains Programmer, systems, Records, Test Results / Process Orders 0 []  - Staff telephones HHA, Nursing Homes / Clarify orders / etc 0 []  - Routine Transfer to another Facility (non-emergent condition) 0 AMIERE, MOHER ([id number]) []  - Routine Hospital Admission (non-emergent condition) 0 []  - New Admissions / Biomedical engineer / Ordering NPWT, Apligraf, etc. 0 []  - Emergency Hospital Admission (emergent condition) 0 X - Simple Discharge Coordination 1 10 []  - Complex (extensive) Discharge Coordination 0 PROCESS - Special Needs []  - Pediatric / Minor Patient Management 0 []  - Isolation Patient Management 0 []  - Hearing / Language / Visual special needs 0 []  - Assessment of Community assistance (transportation, D/C planning, etc.) 0 []  - Additional assistance / Altered mentation 0 []  - Support Surface(s) Assessment (bed, cushion, seat, etc.) 0 INTERVENTIONS - Wound Cleansing / Measurement []  - Simple Wound Cleansing - one wound 0 X - Complex Wound Cleansing -  multiple wounds 2 5 X - Wound Imaging (photographs - any number of wounds) 1 5 []  - Wound Tracing (instead of photographs) 0 []  -  Simple Wound Measurement - one wound 0 X - Complex Wound Measurement - multiple wounds 2 5 INTERVENTIONS - Wound Dressings X - Small Wound Dressing one or multiple wounds 2 10 []  - Medium Wound Dressing one or multiple wounds 0 []  - Large Wound Dressing one or multiple wounds 0 []  - Application of Medications - topical 0 []  - Application of Medications - injection 0 INTERVENTIONS - Miscellaneous []  - External ear exam 0 Zelenak, Racer C. ([id number]) []  - Specimen Collection (cultures, biopsies, blood, body fluids, etc.) 0 []  - Specimen(s) / Culture(s) sent or taken to Lab for analysis 0 []  - Patient Transfer (multiple staff / Harrel Lemon Lift / Similar devices) 0 []  - Simple Staple / Suture removal (25 or less) 0 []  - Complex Staple / Suture removal (26 or more) 0 []  - Hypo / Hyperglycemic Management (close monitor of Blood Glucose) 0 []  - Ankle / Brachial Index (ABI) - do not check if billed separately 0 X - Vital Signs 1 5 Has the patient been seen at the hospital within the last three years: Yes Total Score: 100 Level Of Care: New/Established - Level 3 Electronic Signature(s) Signed: 05/04/2015 5:42:17 PM By: Montey Hora Entered By: Montey Hora on 05/04/2015 13:45:39 Ulin, [name] ([id number]) -------------------------------------------------------------------------------- Encounter Discharge Information Details Patient Name: [name]. Date of Service: 05/04/2015 12:45 PM Medical Record Number: [id number] Patient Account Number: [id number] Date of Birth/Sex: Oct 09, 1924 (82 y.o. Male) Treating RN: Montey Hora Primary Care Physician: Otilio Miu Other Clinician: Referring Physician: Otilio Miu Treating Physician/Extender: Frann Rider in Treatment: 4 Encounter Discharge Information Items Schedule Follow-up Appointment:  No Medication Reconciliation completed No and provided to Patient/Care Dimetri Armitage: Provided on Clinical Summary of Care: 05/04/2015 Form Type Recipient Paper Patient JS Electronic Signature(s) Signed: 05/04/2015 1:47:11 PM By: Ruthine Dose Entered By: Ruthine Dose on 05/04/2015 13:47:11 Huq, [name] ([id number]) -------------------------------------------------------------------------------- Lower Extremity Assessment Details Patient Name: [name]. Date of Service: 05/04/2015 12:45 PM Medical Record Number: [id number] Patient Account Number: [id number] Date of Birth/Sex: [birth year] (82 y.o. Male) Treating RN: Montey Hora Primary Care Physician: Otilio Miu Other Clinician: Referring Physician: Otilio Miu Treating Physician/Extender: Frann Rider in Treatment: 4 Electronic Signature(s) Signed: 05/04/2015 5:42:17 PM By: Montey Hora Entered By: Montey Hora on 05/04/2015 13:07:23 Robel, [name] ([id number]) -------------------------------------------------------------------------------- Multi Wound Chart Details Patient Name: CLAYBOURNE, BORING. Date of Service: 05/04/2015 12:45 PM Medical Record Number: [id number] Patient Account Number: [id number] Date of Birth/Sex: 13-Nov-1924 (82 y.o. 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] [2:Malignant Wound] [N/A:N/A] Comorbid History: [1:Cataracts, Anemia, Chronic Obstructive Pulmonary  Disease (COPD), Hypertension, Rheumatoid Arthritis, Received Chemotherapy, Received Chemotherapy, Received Radiation] [2:Cataracts, Anemia, Chronic Obstructive Pulmonary Disease  (COPD), Hypertension, Rheumatoid Arthritis, Received Radiation] [N/A:N/A] Date Acquired: [1:02/12/2015] [2:03/12/2015] [N/A:N/A] Weeks of Treatment: [1:4] [2:4] [N/A:N/A] Wound Status: [1:Open] [2:Open] [N/A:N/A] Measurements L x W x D 6x3.1x2 [2:1.7x2x0.2] [N/A:N/A] (cm) Area (cm) : [1:14.608] [2:2.67] [N/A:N/A] Volume (cm) : [1:29.217] [2:0.534] [N/A:N/A] % Reduction in Area: [1:-121.40%] [2:-13.30%] [N/A:N/A] % Reduction in Volume: -195.20% [2:-126.30%] [N/A:N/A] Classification: [1:Full Thickness Without Exposed Support Structures] [2:Partial Thickness] [N/A:N/A] Exudate Amount: [1:Large] [2:Large] [N/A:N/A] Exudate Type: [  1:Serous] [2:Serous] [N/A:N/A] Exudate Color: [1:amber] [2:amber] [N/A:N/A] Wound Margin: [1:Well defined, not attached Indistinct, nonvisible] [N/A:N/A] Granulation Amount: [1:None Present (0%)] [2:None Present (0%)] [N/A:N/A] Necrotic Amount: [1:Large (67-100%)] [2:Large (67-100%)] [N/A:N/A] Exposed Structures: [N/A:N/A] Fascia: No Fascia: No Fat: No Fat: No Tendon: No Tendon: No Muscle: No Muscle: No Joint: No Joint: No Bone: No Bone: No Limited to Skin Limited to Skin Breakdown Breakdown Epithelialization: None None N/A Periwound Skin Texture: Edema: Yes Edema: Yes N/A Excoriation: No Scarring: Yes Induration: No Excoriation: No Callus: No Induration: No Crepitus: No Callus: No Fluctuance: No Crepitus: No Friable: No Fluctuance: No Rash: No Friable: No Scarring: No Rash: No Periwound Skin Maceration: Yes Maceration: Yes N/A Moisture: Moist: Yes Moist: Yes Dry/Scaly: No Dry/Scaly: No Periwound Skin Color: Atrophie Blanche: No Atrophie Blanche: No N/A Cyanosis: No Cyanosis: No Ecchymosis: No Ecchymosis: No Erythema: No Erythema: No Hemosiderin  Staining: No Hemosiderin Staining: No Mottled: No Mottled: No Pallor: No Pallor: No Rubor: No Rubor: No Temperature: No Abnormality No Abnormality N/A Tenderness on Yes Yes N/A Palpation: Wound Preparation: Ulcer Cleansing: Ulcer Cleansing: N/A Rinsed/Irrigated with Rinsed/Irrigated with Saline Saline Treatment Notes Electronic Signature(s) Signed: 05/04/2015 5:42:17 PM By: Montey Hora Entered By: Montey Hora on 05/04/2015 13:15:29 Panella, [name] ([id number]) -------------------------------------------------------------------------------- Multi-Disciplinary Care Plan Details Patient Name: [name]. Date of Service: 05/04/2015 12:45 PM Medical Record Number: [id number] Patient Account Number: [id number] Date of Birth/Sex: [birth year] (82 y.o. Male) Treating RN: Montey Hora Primary Care Physician: Otilio Miu Other Clinician: Referring Physician: Otilio Miu Treating Physician/Extender: Frann Rider in Treatment: 4 Active Inactive Malignancy/Atypical Etiology Nursing Diagnoses: Knowledge deficit related to disease process and management of atypical ulcer etiology Goals: Patient/caregiver will verbalize understanding of disease process and disease management of atypical ulcer etiology Date Initiated: 04/06/2015 Goal Status: Active Interventions: Assess patient and family medical history for signs and symptoms of malignancy/atypical etiology upon admission Notes: Necrotic Tissue Nursing Diagnoses: Impaired tissue integrity related to necrotic/devitalized tissue Goals: Necrotic/devitalized tissue will be minimized in the wound bed Date Initiated: 04/06/2015 Goal Status: Active Interventions: Assess patient pain level pre-, during and post procedure and prior to discharge Notes: Orientation to the Wound Care Program Nursing Diagnoses: Knowledge deficit related to the wound healing center program Goals: JYRON, STUEWE  ([id number]) Patient/caregiver will verbalize understanding of the [facility] Date Initiated: 04/06/2015 Goal Status: Active Interventions: Provide education on orientation to the wound center Notes: Wound/Skin Impairment Nursing Diagnoses: Impaired tissue integrity Goals: Patient/caregiver will verbalize understanding of skin care regimen Date Initiated: 04/06/2015 Goal Status: Active Interventions: Assess patient/caregiver ability to perform ulcer/skin care regimen upon admission and as needed Notes: Electronic Signature(s) Signed: 05/04/2015 5:42:17 PM By: Montey Hora Entered By: Montey Hora on 05/04/2015 13:15:20 Lachapelle, [name] ([id number]) -------------------------------------------------------------------------------- Pain Assessment Details Patient Name: [name]. Date of Service: 05/04/2015 12:45 PM Medical Record Number: [id number] Patient Account Number: [id number] Date of Birth/Sex: [birth year] (82 y.o. Male) Treating RN: Montey Hora Primary Care Physician: Otilio Miu Other Clinician: Referring Physician: Otilio Miu Treating Physician/Extender: Frann Rider in Treatment: 4 Active Problems Location of Pain Severity and Description of Pain Patient Has Paino Yes Site Locations Pain Location: Pain in Ulcers With Dressing Change: Yes Duration of the Pain. Constant / Intermittento Constant Pain Management and Medication Current Pain Management: Electronic Signature(s) Signed: 05/04/2015 5:42:17 PM By: Montey Hora Entered By: Montey Hora on 05/04/2015 13:02:47 Davlin, [name] ([id number]) -------------------------------------------------------------------------------- Patient/Caregiver Education Details Patient Name: [name] Date of Service: 05/04/2015  12:45 PM Medical Record Number: [id number] Patient Account Number: [id number] Date of Birth/Gender: 01/30/1925 (82 y.o. Male) Treating RN: Montey Hora Primary Care Physician: Otilio Miu Other Clinician: Referring Physician: Otilio Miu Treating Physician/Extender: Frann Rider in Treatment: 4 Education Assessment Education Provided To: Patient and Caregiver Education Topics Provided Wound/Skin Impairment: Handouts: Other: wound care as ordered Methods: Demonstration, Explain/Verbal Responses: State content correctly Electronic Signature(s) Signed: 05/04/2015 2:07:45 PM By: Montey Hora Entered By: Montey Hora on 05/04/2015 14:07:45 Hamed, [name] ([id number]) -------------------------------------------------------------------------------- Wound Assessment Details Patient Name: [name]. Date of Service: 05/04/2015 12:45 PM Medical Record Number: [id number] Patient Account Number: [id number] Date of Birth/Sex: July 07, 1924 (82 y.o. 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 Wound Open Wounding Event: Gradually Appeared Status: Date Acquired: 02/12/2015 Comorbid Cataracts, Anemia, Chronic Obstructive Weeks Of Treatment: 4 History: Pulmonary Disease (COPD), Clustered Wound: No Hypertension, Rheumatoid Arthritis, Received Chemotherapy, Received Radiation Photos Photo Uploaded By: Montey Hora on 05/04/2015 16:51:02 Wound Measurements Length: (cm) 6 Width: (cm) 3.1 Depth: (cm) 2 Area: (cm) 14.608 Volume: (cm) 29.217 % Reduction in Area: -121.4% % Reduction in Volume: -195.2% Epithelialization: None Tunneling: No Undermining: No Wound Description Full Thickness Without Exposed Foul Odor After Classification: Support Structures Wound Margin: Well defined, not attached Exudate Large Amount: Exudate Type: Serous Exudate Color: amber Cleansing:  No Wound Bed Aken, Ralphie C. ([id number]) Granulation Amount: None Present (0%) Exposed Structure Necrotic Amount: Large (67-100%) Fascia Exposed: No Necrotic Quality: Adherent Slough Fat Layer Exposed: No Tendon Exposed: No Muscle Exposed: No Joint Exposed: No Bone Exposed: No Limited to Skin Breakdown Periwound Skin Texture Texture Color No Abnormalities Noted: No No Abnormalities Noted: No Callus: No Atrophie Blanche: No Crepitus: No Cyanosis: No Excoriation: No Ecchymosis: No Fluctuance: No Erythema: No Friable: No Hemosiderin Staining: No Induration: No Mottled: No Localized Edema: Yes Pallor: No Rash: No Rubor: No Scarring: No Temperature / Pain Moisture Temperature: No Abnormality No Abnormalities Noted: No Tenderness on Palpation: Yes Dry / Scaly: No Maceration: Yes Moist: Yes Wound Preparation Ulcer Cleansing: Rinsed/Irrigated with Saline Treatment Notes Wound #1 (Right Neck) 1. Cleansed with: Clean wound with Normal Saline 2. Anesthetic Topical Lidocaine 4% cream to wound bed prior to debridement 4. Dressing Applied: Aquacel Ag Other dressing (specify in notes) 5. Secondary Dressing Applied ABD Pad 7. Secured with Tape Notes carboflex THEORDORE, VOLBRECHT ([id number]) Electronic Signature(s) Signed: 05/04/2015 5:42:17 PM By: Montey Hora Entered By: Montey Hora on 05/04/2015 13:14:33 Mavis, [name] ([id number]) -------------------------------------------------------------------------------- Wound Assessment Details Patient Name: [name]. Date of Service: 05/04/2015 12:45 PM Medical Record Number: [id number] Patient Account Number: [id number] Date of Birth/Sex: 08-Aug-1924 (82 y.o. Male) Treating RN: Montey Hora Primary Care Physician: Otilio Miu Other Clinician: Referring Physician: Otilio Miu Treating Physician/Extender: Frann Rider in Treatment: 4 Wound Status Wound Number: 2 Primary Malignant  Wound Etiology: Wound Location: Right Ear Wound Open Wounding Event: Gradually Appeared Status: Date Acquired: 03/12/2015 Comorbid Cataracts, Anemia, Chronic Obstructive Weeks Of Treatment: 4 History: Pulmonary Disease (COPD), Clustered Wound: No Hypertension, Rheumatoid Arthritis, Received Chemotherapy, Received Radiation Photos Photo Uploaded By: Montey Hora on 05/04/2015 16:51:03 Wound Measurements Length: (cm) 1.7 Width: (cm) 2 Depth: (cm) 0.2 Area: (cm) 2.67 Volume: (cm) 0.534 % Reduction in Area: -13.3% % Reduction in Volume: -126.3% Epithelialization: None Tunneling: No Undermining: No Wound Description Classification: Partial  Thickness Wound Margin: Indistinct, nonvisible Exudate Amount: Large Exudate Type: Serous Exudate Color: amber Wound Bed Granulation Amount: None Present (0%) Exposed Structure Necrotic Amount: Large (67-100%) Fascia Exposed: No Balli, Timothee C. ([id number]) Necrotic Quality: Adherent Slough Fat Layer Exposed: No Tendon Exposed: No Muscle Exposed: No Joint Exposed: No Bone Exposed: No Limited to Skin Breakdown Periwound Skin Texture Texture Color No Abnormalities Noted: No No Abnormalities Noted: No Callus: No Atrophie Blanche: No Crepitus: No Cyanosis: No Excoriation: No Ecchymosis: No Fluctuance: No Erythema: No Friable: No Hemosiderin Staining: No Induration: No Mottled: No Localized Edema: Yes Pallor: No Rash: No Rubor: No Scarring: Yes Temperature / Pain Moisture Temperature: No Abnormality No Abnormalities Noted: No Tenderness on Palpation: Yes Dry / Scaly: No Maceration: Yes Moist: Yes Wound Preparation Ulcer Cleansing: Rinsed/Irrigated with Saline Treatment Notes Wound #2 (Right Ear) 1. Cleansed with: Clean wound with Normal Saline 2. Anesthetic Topical Lidocaine 4% cream to wound bed prior to debridement 4. Dressing Applied: Aquacel Ag Other dressing (specify in notes) 5. Secondary  Dressing Applied ABD Pad 7. Secured with Tape Notes carboflex Electronic Signature(s) Signed: 05/04/2015 5:42:17 PM By: Mickel Baas ([id number]) Entered By: Montey Hora on 05/04/2015 13:14:42 Ballowe, [name] ([id number]) -------------------------------------------------------------------------------- [place] Details Patient Name: [name]. Date of Service: 05/04/2015 12:45 PM Medical Record Number: [id number] Patient Account Number: [id number] Date of Birth/Sex: Mar 19, 1925 (82 y.o. Male) Treating RN: Montey Hora Primary Care Physician: Otilio Miu Other Clinician: Referring Physician: Otilio Miu Treating Physician/Extender: Frann Rider in Treatment: 4 Vital Signs Time Taken: 13:07 Pulse (bpm): 96 Height (in): 72 Respiratory Rate (breaths/min): 18 Weight (lbs): 141 Blood Pressure (mmHg): 105/60 Body Mass Index (BMI): 19.1 Reference Range: 80 - 120 mg / dl Electronic Signature(s) Signed: 05/04/2015 5:42:17 PM By: Montey Hora Entered By: Montey Hora on 05/04/2015 13:07:14

## 2015-05-05 NOTE — Progress Notes (Signed)
[name] ([id number]) Visit Report for 05/04/2015 Chief Complaint Document Details Patient Name: [name], [name]. Date of Service: 05/04/2015 12:45 PM Medical Record Number: [id number] Patient Account Number: [id number] Date of Birth/Sex: 10-10-1924 (82 y.o. Male) Treating RN: Montey Hora Primary Care Physician: Otilio Miu Other Clinician: Referring Physician: Otilio Miu Treating Physician/Extender: Frann Rider in Treatment: 4 Information Obtained from: Patient Chief Complaint Patient presents to the wound care center for a consult due non healing wound to the right ear and neck which has been there for several years and is now getting progressively worse. An open ulceration on the right neck has been there for about 4 months Electronic Signature(s) Signed: 05/04/2015 1:39:49 PM By: Christin Fudge MD, FACS Entered By: Christin Fudge on 05/04/2015 13:39:49 Ahles, [name] ([id number]) -------------------------------------------------------------------------------- HPI Details Patient Name: [name]. Date of Service: 05/04/2015 12:45 PM Medical Record Number: [id number] Patient Account Number: [id number] Date of Birth/Sex: [birth year] (82 y.o. Male) Treating RN: Montey Hora Primary Care Physician: Otilio Miu Other Clinician: Referring Physician: Otilio Miu Treating Physician/Extender: Frann Rider in Treatment: 4 History of Present Illness Location: ulcerated area right neck Quality: Patient reports experiencing a shooting pain to affected area(s). Severity: Patient states wound are getting worse. Duration: Patient has had the wound for > 3 months prior to seeking treatment at the wound center Timing: Pain in wound is constant (hurts all the time) Context: The wound appeared gradually over time Modifying Factors: Consults to this date include:dermatology, PCP and oncology Associated Signs and Symptoms: Patient reports having increase  swelling. 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 He initially had excisional surgery and the disease got progressively worse and recurred. He then had radiation therapy in about 2014. Patient continued to have progressive disease and then had a large neck mass which was treated with Erivedge and this was given to him regularly right up to May. The patient had a lot of issues with the chemotherapy and with radiation therapy and has stopped all treatment and did not want to go back to the cancer center. His daughter was at the bedside has been doing his dressings and noted that this has now ulcerated over the last 3 months and she has been doing local care and was concerned about infection. I do not believe she understands the gravity of the situation. 05/05/2015 -- the daughter who is the caregiver and is at the bedside says that the wound is started having a bad odor and continues to have quite a bit of drainage. Electronic Signature(s) Signed: 05/04/2015 1:40:15 PM By: Christin Fudge MD, FACS Entered By: Christin Fudge on 05/04/2015 13:40:14 Deriso, [name] ([id number]) -------------------------------------------------------------------------------- Physical Exam Details Patient Name: Tyrone, Barton. Date of Service: 05/04/2015 12:45 PM Medical Record Number: [id number] Patient Account Number: [id number] Date of Birth/Sex: 1925/02/13 (82 y.o. Male) Treating RN: Montey Hora Primary Care Physician: Otilio Miu Other Clinician: Referring Physician: Otilio Miu Treating Physician/Extender: Frann Rider in Treatment: 4 Constitutional . Pulse regular. Respirations normal and unlabored. Afebrile. . Eyes Nonicteric. Reactive to light. Ears, Nose, Mouth, and Throat Lips, teeth, and gums WNL.Marland Kitchen Moist mucosa without lesions. Neck the patient has part of his right ear excised and he has a large hard fungating mass in  the region of his mastoid and upper neck region with ulceration down to the subcutaneous tissue. the mass is fixed and tender and there is a fungating base.. Normal sized  without goiter. Respiratory WNL. No retractions.. Breath sounds WNL, No rubs, rales, rhonchi, or wheeze.. Cardiovascular Heart rhythm and rate regular, no murmur or gallop.. Pedal Pulses WNL. No clubbing, cyanosis or edema. Chest Breasts symmetical and no nipple discharge.. Breast tissue WNL, no masses, lumps, or tenderness.. Lymphatic No adneopathy. No adenopathy. No adenopathy. Musculoskeletal Adexa without tenderness or enlargement.. Digits and nails w/o clubbing, cyanosis, infection, petechiae, ischemia, or inflammatory conditions.. Integumentary (Hair, Skin) No suspicious lesions. No crepitus or fluctuance. No peri-wound warmth or erythema. No masses.Marland Kitchen Psychiatric Judgement and insight Intact.. No evidence of depression, anxiety, or agitation.. Notes the wound as described above is extremely tender but the base is much cleaner than the last time around and there is no evidence of active bleeding Electronic Signature(s) Signed: 05/04/2015 1:41:07 PM By: Christin Fudge MD, FACS Entered By: Christin Fudge on 05/04/2015 13:41:06 Rubinstein, [name] ([id number]) -------------------------------------------------------------------------------- Physician Orders Details Patient Name: [name]. Date of Service: 05/04/2015 12:45 PM Medical Record Number: [id number] Patient Account Number: [id number] Date of Birth/Sex: 08-07-1924 (82 y.o. Male) Treating RN: Montey Hora Primary Care Physician: Otilio Miu Other Clinician: Referring Physician: Otilio Miu Treating Physician/Extender: Frann Rider in Treatment: 4 Verbal / Phone Orders: Yes Clinician: Montey Hora Read Back and Verified: Yes Diagnosis Coding Wound Cleansing Wound #1 Right Neck o Clean wound with Normal Saline. Wound #2 Right Ear o  Clean wound with Normal Saline. Anesthetic Wound #1 Right Neck o Topical Lidocaine 4% cream applied to wound bed prior to debridement Wound #2 Right Ear o Topical Lidocaine 4% cream applied to wound bed prior to debridement Primary Wound Dressing Wound #1 Right Neck o Aquacel Ag Wound #2 Right Ear o Aquacel Ag Secondary Dressing Wound #1 Right Neck o ABD pad - and carboflex o Non-adherent pad Wound #2 Right Ear o ABD pad - and carboflex o Non-adherent pad Dressing Change Frequency Wound #1 Right Neck o Change dressing every other day. Wound #2 Right Ear o Change dressing every other day. KOHLBY, GILLIM ([id number]) Follow-up Appointments Wound #1 Right Neck o Return Appointment in 1 month Wound #2 Right Ear o Return Appointment in 1 month Electronic Signature(s) Signed: 05/04/2015 5:05:49 PM By: Christin Fudge MD, FACS Signed: 05/04/2015 5:42:17 PM By: Montey Hora Entered By: Montey Hora on 05/04/2015 13:32:31 Barthel, [name] ([id number]) -------------------------------------------------------------------------------- Problem List Details Patient Name: SAVEON, BARSAMIAN. Date of Service: 05/04/2015 12:45 PM Medical Record Number: [id number] Patient Account Number: [id number] Date of Birth/Sex: 07-23-1924 (82 y.o. 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 Date of Service: 05/04/2015 12:45 PM Medical Record Number: [id number] Patient Account Number: [id number] Date of Birth/Sex: Nov 10, 1924 (82 y.o. Male) Treating RN: Montey Hora Primary Care Physician: Otilio Miu Other Clinician: Referring Physician: Otilio Miu Treating Physician/Extender: Frann Rider in Treatment: 4 Subjective Chief Complaint Information obtained from Patient Patient presents to the wound care center for a consult due non  healing wound to the right ear and neck which has been there for several years and is now getting progressively worse. An open ulceration on the right neck has been there for about 4 months History of Present Illness (HPI) The following HPI elements were documented for the patient's wound: Location: ulcerated area right neck Quality: Patient reports experiencing a shooting pain to affected area(s). Severity: Patient states wound are getting worse. Duration: Patient has had the wound for > 3 months prior to seeking treatment at the wound center Timing: Pain in wound is constant (hurts all the time) Context: The wound appeared gradually over time Modifying Factors: Consults to this date include:dermatology, PCP and oncology Associated Signs and Symptoms: Patient reports having increase swelling. 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 He initially had excisional surgery and the disease got progressively worse and recurred. He then had radiation therapy in about 2014. Patient continued to have progressive disease and then had a large neck mass which was treated with Erivedge and this was given to him regularly right up to May. The patient had a lot of issues with the chemotherapy and with  radiation therapy and has stopped all treatment and did not want to go back to the cancer center. His daughter was at the bedside has been doing his dressings and noted that this has now ulcerated over the last 3 months and she has been doing local care and was concerned about infection. I do not believe she understands the gravity of the situation. 05/05/2015 -- the daughter who is the caregiver and is at the bedside says that the wound is started having a bad odor and continues to have quite a bit of drainage. Objective Constitutional Pulse regular. Respirations normal and unlabored. Afebrile. PIERRE, BLAUSER ([id number]) Vitals Time Taken: 1:07 PM, Height: 72 in, Weight: 141 lbs, BMI: 19.1, Pulse: 96 bpm, Respiratory Rate: 18 breaths/min, Blood Pressure: 105/60 mmHg. Eyes Nonicteric. Reactive to light. Ears, Nose, Mouth, and Throat Lips, teeth, and gums WNL.Marland Kitchen Moist mucosa without lesions. Neck the patient has part of his right ear excised and he has a large hard fungating mass in the region of his mastoid and upper neck region with ulceration down to the subcutaneous tissue. the mass is fixed and tender and there is a fungating base.. Normal sized without goiter. Respiratory WNL. No retractions.. Breath sounds WNL, No rubs, rales, rhonchi, or wheeze.. Cardiovascular Heart rhythm and rate regular, no murmur or gallop.. Pedal Pulses WNL. No clubbing, cyanosis or edema. Chest Breasts symmetical and no nipple discharge.. Breast tissue WNL, no masses, lumps, or tenderness.. Lymphatic No adneopathy. No adenopathy. No adenopathy. Musculoskeletal Adexa without tenderness or enlargement.. Digits and nails w/o clubbing, cyanosis, infection, petechiae, ischemia, or inflammatory conditions.Marland Kitchen Psychiatric Judgement and insight Intact.. No evidence of depression, anxiety, or agitation.. General Notes: the wound as described above is extremely tender but the base is much cleaner than the last  time around and there is no evidence of active bleeding Integumentary (Hair, Skin) No suspicious lesions. No crepitus or fluctuance. No peri-wound warmth or erythema. No masses.. Wound #1 status is Open. Original cause of wound was Gradually Appeared. The wound is located on the Right Neck. The wound measures 6cm length x 3.1cm width x 2cm depth; 14.608cm^2 area and 29.217cm^3 volume. The wound is limited to skin breakdown. There is no tunneling or undermining noted. There is a large amount of serous drainage noted. The wound  margin is well defined and not attached to the wound base. There is no granulation within the wound bed. There is a large (67-100%) amount of necrotic tissue within the wound bed including Adherent Slough. The periwound skin appearance exhibited: Localized Edema, Maceration, Moist. The periwound skin appearance did not exhibit: Callus, Crepitus, Excoriation, Fluctuance, Friable, Induration, Rash, Scarring, Dry/Scaly, Atrophie Blanche, Cyanosis, Ecchymosis, Hemosiderin Staining, Mottled, Pallor, Rubor, Erythema. Periwound temperature was noted as GENERAL, FILOSA C. ([id number]) No Abnormality. The periwound has tenderness on palpation. Wound #2 status is Open. Original cause of wound was Gradually Appeared. The wound is located on the Right Ear. The wound measures 1.7cm length x 2cm width x 0.2cm depth; 2.67cm^2 area and 0.534cm^3 volume. The wound is limited to skin breakdown. There is no tunneling or undermining noted. There is a large amount of serous drainage noted. The wound margin is indistinct and nonvisible. There is no granulation within the wound bed. There is a large (67-100%) amount of necrotic tissue within the wound bed including Adherent Slough. The periwound skin appearance exhibited: Localized Edema, Scarring, Maceration, Moist. The periwound skin appearance did not exhibit: Callus, Crepitus, Excoriation, Fluctuance, Friable, Induration, Rash, Dry/Scaly,  Atrophie Blanche, Cyanosis, Ecchymosis, Hemosiderin Staining, Mottled, Pallor, Rubor, Erythema. Periwound temperature was noted as No Abnormality. The periwound has tenderness on palpation. Assessment Active Problems ICD-10 C44.319 - Basal cell carcinoma of skin of other parts of face C44.40 - Unspecified malignant neoplasm of skin of scalp and neck L98.492 - Non-pressure chronic ulcer of skin of other sites with fat layer exposed Plan Wound Cleansing: Wound #1 Right Neck: Clean wound with Normal Saline. Wound #2 Right Ear: Clean wound with Normal Saline. Anesthetic: Wound #1 Right Neck: Topical Lidocaine 4% cream applied to wound bed prior to debridement Wound #2 Right Ear: Topical Lidocaine 4% cream applied to wound bed prior to debridement Primary Wound Dressing: Wound #1 Right Neck: Aquacel Ag Wound #2 Right Ear: Aquacel Ag Secondary Dressing: Wound #1 Right Neck: ABD pad - and carboflex PHAROH, LEBAR. ([id number]) Non-adherent pad Wound #2 Right Ear: ABD pad - and carboflex Non-adherent pad Dressing Change Frequency: Wound #1 Right Neck: Change dressing every other day. Wound #2 Right Ear: Change dressing every other day. Follow-up Appointments: Wound #1 Right Neck: Return Appointment in 1 month Wound #2 Right Ear: Return Appointment in 1 month I have recommended gently packing this wound with Aquacel Ag rope and covering it with gauze and tape to keep in place.We will also use some couple flecks over this to mask the odor We would be happy to see the patient back as often as needed and we do try control order and discharge from this wound. Electronic Signature(s) Signed: 05/04/2015 1:41:48 PM By: Christin Fudge MD, FACS Entered By: Christin Fudge on 05/04/2015 13:41:48 Kohlenberg, [name] ([id number]) -------------------------------------------------------------------------------- SuperBill Details Patient Name: [name]. Date of Service:  05/04/2015 Medical Record Number: [id number] Patient Account Number: [id number] Date of Birth/Sex: 02/01/1925 (82 y.o. Male) Treating RN: Montey Hora Primary Care Physician: Otilio Miu Other Clinician: Referring Physician: Otilio Miu Treating Physician/Extender: Frann Rider in Treatment: 4 Diagnosis Coding ICD-10 Codes Code Description [id number] Basal cell carcinoma of skin of other parts of face C44.40 Unspecified malignant neoplasm of skin of scalp and neck L98.492 Non-pressure chronic ulcer of skin of other sites with fat layer exposed Facility Procedures CPT4 Code: [id number] Description: 99213 - WOUND CARE VISIT-LEV 3 EST PT Modifier: Quantity: 1 Physician Procedures CPT4 Code Description: [id number] - WC  PHYS LEVEL 3 - EST PT ICD-10 Description Diagnosis C44.319 Basal cell carcinoma of skin of other parts of face C44.40 Unspecified malignant neoplasm of skin of scalp and L98.492 Non-pressure chronic ulcer of skin  of other sites w Modifier: neck ith fat layer Quantity: 1 exposed Electronic Signature(s) Signed: 05/04/2015 5:05:49 PM By: Christin Fudge MD, FACS Signed: 05/04/2015 5:42:17 PM By: Montey Hora Previous Signature: 05/04/2015 1:42:01 PM Version By: Christin Fudge MD, FACS Entered By: Montey Hora on 05/04/2015 13:45:48

## 2015-05-07 ENCOUNTER — Encounter: Payer: Self-pay | Admitting: Radiology

## 2015-05-07 ENCOUNTER — Emergency Department: Payer: Medicare Other

## 2015-05-07 ENCOUNTER — Inpatient Hospital Stay
Admission: EM | Admit: 2015-05-07 | Discharge: 2015-05-12 | DRG: 871 | Disposition: A | Discharge status: Skilled Nursing Facility | Payer: Medicare Other | Attending: Internal Medicine | Admitting: Internal Medicine

## 2015-05-07 ENCOUNTER — Other Ambulatory Visit: Payer: Self-pay

## 2015-05-07 DIAGNOSIS — N179 Acute kidney failure, unspecified: Secondary | ICD-10-CM | POA: Diagnosis present

## 2015-05-07 DIAGNOSIS — Z79899 Other long term (current) drug therapy: Secondary | ICD-10-CM

## 2015-05-07 DIAGNOSIS — C4442 Squamous cell carcinoma of skin of scalp and neck: Secondary | ICD-10-CM | POA: Diagnosis present

## 2015-05-07 DIAGNOSIS — Z515 Encounter for palliative care: Secondary | ICD-10-CM | POA: Diagnosis present

## 2015-05-07 DIAGNOSIS — J690 Pneumonitis due to inhalation of food and vomit: Secondary | ICD-10-CM | POA: Diagnosis present

## 2015-05-07 DIAGNOSIS — M069 Rheumatoid arthritis, unspecified: Secondary | ICD-10-CM | POA: Diagnosis present

## 2015-05-07 DIAGNOSIS — N400 Enlarged prostate without lower urinary tract symptoms: Secondary | ICD-10-CM | POA: Diagnosis present

## 2015-05-07 DIAGNOSIS — R42 Dizziness and giddiness: Secondary | ICD-10-CM | POA: Diagnosis present

## 2015-05-07 DIAGNOSIS — W19XXXA Unspecified fall, initial encounter: Secondary | ICD-10-CM | POA: Diagnosis present

## 2015-05-07 DIAGNOSIS — Z886 Allergy status to analgesic agent status: Secondary | ICD-10-CM

## 2015-05-07 DIAGNOSIS — S0101XA Laceration without foreign body of scalp, initial encounter: Secondary | ICD-10-CM | POA: Diagnosis present

## 2015-05-07 DIAGNOSIS — C07 Malignant neoplasm of parotid gland: Secondary | ICD-10-CM | POA: Diagnosis present

## 2015-05-07 DIAGNOSIS — H9190 Unspecified hearing loss, unspecified ear: Secondary | ICD-10-CM | POA: Diagnosis present

## 2015-05-07 DIAGNOSIS — S2241XA Multiple fractures of ribs, right side, initial encounter for closed fracture: Secondary | ICD-10-CM | POA: Diagnosis present

## 2015-05-07 DIAGNOSIS — C419 Malignant neoplasm of bone and articular cartilage, unspecified: Secondary | ICD-10-CM | POA: Diagnosis present

## 2015-05-07 DIAGNOSIS — Z889 Allergy status to unspecified drugs, medicaments and biological substances status: Secondary | ICD-10-CM

## 2015-05-07 DIAGNOSIS — Z66 Do not resuscitate: Secondary | ICD-10-CM | POA: Diagnosis not present

## 2015-05-07 DIAGNOSIS — R269 Unspecified abnormalities of gait and mobility: Secondary | ICD-10-CM | POA: Diagnosis present

## 2015-05-07 DIAGNOSIS — Z9181 History of falling: Secondary | ICD-10-CM

## 2015-05-07 DIAGNOSIS — L0211 Cutaneous abscess of neck: Secondary | ICD-10-CM | POA: Diagnosis present

## 2015-05-07 DIAGNOSIS — A419 Sepsis, unspecified organism: Principal | ICD-10-CM | POA: Diagnosis present

## 2015-05-07 DIAGNOSIS — A310 Pulmonary mycobacterial infection: Secondary | ICD-10-CM | POA: Diagnosis present

## 2015-05-07 DIAGNOSIS — J449 Chronic obstructive pulmonary disease, unspecified: Secondary | ICD-10-CM | POA: Diagnosis present

## 2015-05-07 DIAGNOSIS — J189 Pneumonia, unspecified organism: Secondary | ICD-10-CM | POA: Diagnosis present

## 2015-05-07 DIAGNOSIS — Z7952 Long term (current) use of systemic steroids: Secondary | ICD-10-CM

## 2015-05-07 DIAGNOSIS — I10 Essential (primary) hypertension: Secondary | ICD-10-CM | POA: Diagnosis present

## 2015-05-07 DIAGNOSIS — E43 Unspecified severe protein-calorie malnutrition: Secondary | ICD-10-CM | POA: Insufficient documentation

## 2015-05-07 DIAGNOSIS — Z888 Allergy status to other drugs, medicaments and biological substances status: Secondary | ICD-10-CM

## 2015-05-07 DIAGNOSIS — R52 Pain, unspecified: Secondary | ICD-10-CM

## 2015-05-07 DIAGNOSIS — Z880 Allergy status to penicillin: Secondary | ICD-10-CM

## 2015-05-07 DIAGNOSIS — K219 Gastro-esophageal reflux disease without esophagitis: Secondary | ICD-10-CM | POA: Diagnosis present

## 2015-05-07 DIAGNOSIS — F17210 Nicotine dependence, cigarettes, uncomplicated: Secondary | ICD-10-CM | POA: Diagnosis present

## 2015-05-07 HISTORY — DX: Rheumatoid arthritis, unspecified: M06.9

## 2015-05-07 HISTORY — DX: Basal cell carcinoma of skin, unspecified: C44.91

## 2015-05-07 LAB — CBC WITH DIFFERENTIAL/PLATELET
BASOS ABS: 0 10*3/uL (ref 0–0.1)
BASOS PCT: 0 %
EOS ABS: 0.1 10*3/uL (ref 0–0.7)
Eosinophils Relative: 0 %
HCT: 39.5 % — ABNORMAL LOW (ref 40.0–52.0)
Hemoglobin: 12.7 g/dL — ABNORMAL LOW (ref 13.0–18.0)
Lymphocytes Relative: 1 %
Lymphs Abs: 0.3 10*3/uL — ABNORMAL LOW (ref 1.0–3.6)
MCH: 28.8 pg (ref 26.0–34.0)
MCHC: 32.1 g/dL (ref 32.0–36.0)
MCV: 89.5 fL (ref 80.0–100.0)
MONO ABS: 0.6 10*3/uL (ref 0.2–1.0)
MONOS PCT: 2 %
Neutro Abs: 23.4 10*3/uL — ABNORMAL HIGH (ref 1.4–6.5)
Neutrophils Relative %: 97 %
PLATELETS: 259 10*3/uL (ref 150–440)
RBC: 4.41 MIL/uL (ref 4.40–5.90)
RDW: 17.1 % — AB (ref 11.5–14.5)
WBC: 24.3 10*3/uL — ABNORMAL HIGH (ref 3.8–10.6)

## 2015-05-07 LAB — COMPREHENSIVE METABOLIC PANEL
ALBUMIN: 3.5 g/dL (ref 3.5–5.0)
ALT: 22 U/L (ref 17–63)
ANION GAP: 8 (ref 5–15)
AST: 23 U/L (ref 15–41)
Alkaline Phosphatase: 79 U/L (ref 38–126)
BILIRUBIN TOTAL: 0.5 mg/dL (ref 0.3–1.2)
BUN: 46 mg/dL — ABNORMAL HIGH (ref 6–20)
CHLORIDE: 101 mmol/L (ref 101–111)
CO2: 25 mmol/L (ref 22–32)
Calcium: 8.5 mg/dL — ABNORMAL LOW (ref 8.9–10.3)
Creatinine, Ser: 1.63 mg/dL — ABNORMAL HIGH (ref 0.61–1.24)
GFR calc Af Amer: 42 mL/min — ABNORMAL LOW (ref >60)
GFR calc non Af Amer: 36 mL/min — ABNORMAL LOW (ref >60)
GLUCOSE: 127 mg/dL — AB (ref 65–99)
POTASSIUM: 3.9 mmol/L (ref 3.5–5.1)
SODIUM: 134 mmol/L — AB (ref 135–145)
Total Protein: 6.4 g/dL — ABNORMAL LOW (ref 6.5–8.1)

## 2015-05-07 LAB — TROPONIN I: TROPONIN I: 0.03 ng/mL (ref <0.031)

## 2015-05-07 MED ORDER — SODIUM CHLORIDE 0.9 % IV BOLUS (SEPSIS)
500.0000 mL | Freq: Once | INTRAVENOUS | Status: AC
  Administered 2015-05-07: 500 mL via INTRAVENOUS

## 2015-05-07 MED ORDER — IOHEXOL 300 MG/ML  SOLN
75.0000 mL | Freq: Once | INTRAMUSCULAR | Status: AC | PRN
  Administered 2015-05-07: 60 mL via INTRAVENOUS

## 2015-05-07 NOTE — ED Notes (Addendum)
Pt to room 24 via [place] from home; pt A&Ox3; EMS reports pt with hx COPD, HTN, skin CA (dressing D&I to right side face from CA wound); st daughter with pt PTA when pt c/o dizziness and fell hitting head; no LOC; st pt c/o HA, mid CP and SOB; pt reports frontal HA all day; denies any other c/o; small abrasion noted to top of scalp and right side scalp with small amount dried blood

## 2015-05-07 NOTE — ED Notes (Signed)
Pt to CT and xray via stretcher accomp by CT tech

## 2015-05-08 ENCOUNTER — Encounter: Payer: Self-pay | Admitting: Internal Medicine

## 2015-05-08 DIAGNOSIS — J690 Pneumonitis due to inhalation of food and vomit: Secondary | ICD-10-CM | POA: Diagnosis present

## 2015-05-08 DIAGNOSIS — Z888 Allergy status to other drugs, medicaments and biological substances status: Secondary | ICD-10-CM | POA: Diagnosis not present

## 2015-05-08 DIAGNOSIS — I10 Essential (primary) hypertension: Secondary | ICD-10-CM | POA: Diagnosis present

## 2015-05-08 DIAGNOSIS — N400 Enlarged prostate without lower urinary tract symptoms: Secondary | ICD-10-CM | POA: Diagnosis present

## 2015-05-08 DIAGNOSIS — W19XXXA Unspecified fall, initial encounter: Secondary | ICD-10-CM | POA: Diagnosis present

## 2015-05-08 DIAGNOSIS — S0101XA Laceration without foreign body of scalp, initial encounter: Secondary | ICD-10-CM | POA: Diagnosis present

## 2015-05-08 DIAGNOSIS — J189 Pneumonia, unspecified organism: Secondary | ICD-10-CM | POA: Diagnosis present

## 2015-05-08 DIAGNOSIS — Z9181 History of falling: Secondary | ICD-10-CM | POA: Diagnosis not present

## 2015-05-08 DIAGNOSIS — C4442 Squamous cell carcinoma of skin of scalp and neck: Secondary | ICD-10-CM | POA: Diagnosis present

## 2015-05-08 DIAGNOSIS — F17210 Nicotine dependence, cigarettes, uncomplicated: Secondary | ICD-10-CM | POA: Diagnosis present

## 2015-05-08 DIAGNOSIS — R42 Dizziness and giddiness: Secondary | ICD-10-CM | POA: Diagnosis present

## 2015-05-08 DIAGNOSIS — A310 Pulmonary mycobacterial infection: Secondary | ICD-10-CM | POA: Diagnosis present

## 2015-05-08 DIAGNOSIS — Z515 Encounter for palliative care: Secondary | ICD-10-CM | POA: Diagnosis present

## 2015-05-08 DIAGNOSIS — C419 Malignant neoplasm of bone and articular cartilage, unspecified: Secondary | ICD-10-CM | POA: Diagnosis present

## 2015-05-08 DIAGNOSIS — Z79899 Other long term (current) drug therapy: Secondary | ICD-10-CM | POA: Diagnosis not present

## 2015-05-08 DIAGNOSIS — N179 Acute kidney failure, unspecified: Secondary | ICD-10-CM | POA: Diagnosis present

## 2015-05-08 DIAGNOSIS — A419 Sepsis, unspecified organism: Secondary | ICD-10-CM | POA: Diagnosis present

## 2015-05-08 DIAGNOSIS — H9190 Unspecified hearing loss, unspecified ear: Secondary | ICD-10-CM | POA: Diagnosis present

## 2015-05-08 DIAGNOSIS — M069 Rheumatoid arthritis, unspecified: Secondary | ICD-10-CM | POA: Diagnosis present

## 2015-05-08 DIAGNOSIS — Z880 Allergy status to penicillin: Secondary | ICD-10-CM | POA: Diagnosis not present

## 2015-05-08 DIAGNOSIS — R269 Unspecified abnormalities of gait and mobility: Secondary | ICD-10-CM | POA: Diagnosis present

## 2015-05-08 DIAGNOSIS — L0211 Cutaneous abscess of neck: Secondary | ICD-10-CM | POA: Diagnosis present

## 2015-05-08 DIAGNOSIS — Z889 Allergy status to unspecified drugs, medicaments and biological substances status: Secondary | ICD-10-CM | POA: Diagnosis not present

## 2015-05-08 DIAGNOSIS — C07 Malignant neoplasm of parotid gland: Secondary | ICD-10-CM | POA: Diagnosis present

## 2015-05-08 DIAGNOSIS — Z886 Allergy status to analgesic agent status: Secondary | ICD-10-CM | POA: Diagnosis not present

## 2015-05-08 DIAGNOSIS — Z7952 Long term (current) use of systemic steroids: Secondary | ICD-10-CM | POA: Diagnosis not present

## 2015-05-08 DIAGNOSIS — J449 Chronic obstructive pulmonary disease, unspecified: Secondary | ICD-10-CM | POA: Diagnosis present

## 2015-05-08 DIAGNOSIS — Z66 Do not resuscitate: Secondary | ICD-10-CM | POA: Diagnosis not present

## 2015-05-08 DIAGNOSIS — K219 Gastro-esophageal reflux disease without esophagitis: Secondary | ICD-10-CM | POA: Diagnosis present

## 2015-05-08 DIAGNOSIS — S2241XA Multiple fractures of ribs, right side, initial encounter for closed fracture: Secondary | ICD-10-CM | POA: Diagnosis present

## 2015-05-08 LAB — BASIC METABOLIC PANEL
Anion gap: 5 (ref 5–15)
BUN: 41 mg/dL — AB (ref 6–20)
CALCIUM: 8.1 mg/dL — AB (ref 8.9–10.3)
CO2: 26 mmol/L (ref 22–32)
CREATININE: 1.36 mg/dL — AB (ref 0.61–1.24)
Chloride: 104 mmol/L (ref 101–111)
GFR calc Af Amer: 52 mL/min — ABNORMAL LOW (ref >60)
GFR, EST NON AFRICAN AMERICAN: 45 mL/min — AB (ref >60)
GLUCOSE: 121 mg/dL — AB (ref 65–99)
Potassium: 4.3 mmol/L (ref 3.5–5.1)
Sodium: 135 mmol/L (ref 135–145)

## 2015-05-08 LAB — CBC
HCT: 35.8 % — ABNORMAL LOW (ref 40.0–52.0)
Hemoglobin: 12 g/dL — ABNORMAL LOW (ref 13.0–18.0)
MCH: 29.8 pg (ref 26.0–34.0)
MCHC: 33.4 g/dL (ref 32.0–36.0)
MCV: 89.2 fL (ref 80.0–100.0)
Platelets: 205 10*3/uL (ref 150–440)
RBC: 4.01 MIL/uL — ABNORMAL LOW (ref 4.40–5.90)
RDW: 16.8 % — AB (ref 11.5–14.5)
WBC: 14.3 10*3/uL — ABNORMAL HIGH (ref 3.8–10.6)

## 2015-05-08 LAB — TROPONIN I
TROPONIN I: 0.03 ng/mL (ref <0.031)
TROPONIN I: 0.04 ng/mL — AB (ref <0.031)

## 2015-05-08 LAB — STREP PNEUMONIAE URINARY ANTIGEN: STREP PNEUMO URINARY ANTIGEN: NEGATIVE

## 2015-05-08 MED ORDER — TIOTROPIUM BROMIDE MONOHYDRATE 18 MCG IN CAPS
18.0000 ug | ORAL_CAPSULE | Freq: Every day | RESPIRATORY_TRACT | Status: DC
  Administered 2015-05-08 – 2015-05-11 (×4): 18 ug via RESPIRATORY_TRACT
  Filled 2015-05-08 (×2): qty 5

## 2015-05-08 MED ORDER — PREDNISONE 5 MG PO TABS
5.0000 mg | ORAL_TABLET | Freq: Every day | ORAL | Status: DC
  Administered 2015-05-08 – 2015-05-11 (×4): 5 mg via ORAL
  Filled 2015-05-08 (×4): qty 1

## 2015-05-08 MED ORDER — FOLIC ACID 1 MG PO TABS
2.0000 mg | ORAL_TABLET | Freq: Every day | ORAL | Status: DC
  Administered 2015-05-08 – 2015-05-11 (×4): 2 mg via ORAL
  Filled 2015-05-08 (×4): qty 2

## 2015-05-08 MED ORDER — TAMSULOSIN HCL 0.4 MG PO CAPS
0.4000 mg | ORAL_CAPSULE | Freq: Every day | ORAL | Status: DC
  Administered 2015-05-08 – 2015-05-11 (×4): 0.4 mg via ORAL
  Filled 2015-05-08 (×4): qty 1

## 2015-05-08 MED ORDER — ENSURE ENLIVE PO LIQD
237.0000 mL | Freq: Three times a day (TID) | ORAL | Status: DC
  Administered 2015-05-08 – 2015-05-09 (×2): 237 mL via ORAL

## 2015-05-08 MED ORDER — LISINOPRIL-HYDROCHLOROTHIAZIDE 20-25 MG PO TABS: 1.0000 | ORAL_TABLET | Freq: Every day | ORAL | Status: DC

## 2015-05-08 MED ORDER — BUDESONIDE-FORMOTEROL FUMARATE 160-4.5 MCG/ACT IN AERO
2.0000 | INHALATION_SPRAY | Freq: Two times a day (BID) | RESPIRATORY_TRACT | Status: DC
  Administered 2015-05-08 – 2015-05-11 (×8): 2 via RESPIRATORY_TRACT
  Filled 2015-05-08 (×2): qty 6

## 2015-05-08 MED ORDER — MORPHINE SULFATE (PF) 2 MG/ML IV SOLN
2.0000 mg | INTRAVENOUS | Status: DC | PRN
  Administered 2015-05-08 – 2015-05-12 (×14): 2 mg via INTRAVENOUS
  Filled 2015-05-08 (×14): qty 1

## 2015-05-08 MED ORDER — SODIUM CHLORIDE 0.9 % IV SOLN
1.0000 g | Freq: Two times a day (BID) | INTRAVENOUS | Status: DC
  Administered 2015-05-08 – 2015-05-10 (×5): 1 g via INTRAVENOUS
  Filled 2015-05-08 (×8): qty 1

## 2015-05-08 MED ORDER — ENOXAPARIN SODIUM 40 MG/0.4ML ~~LOC~~ SOLN
40.0000 mg | SUBCUTANEOUS | Status: DC
Start: 2015-05-08 — End: 2015-05-08

## 2015-05-08 MED ORDER — ENOXAPARIN SODIUM 40 MG/0.4ML ~~LOC~~ SOLN
40.0000 mg | SUBCUTANEOUS | Status: DC
  Administered 2015-05-08 – 2015-05-11 (×4): 40 mg via SUBCUTANEOUS
  Filled 2015-05-08 (×4): qty 0.4

## 2015-05-08 MED ORDER — NITROGLYCERIN 0.4 MG SL SUBL: 0.4000 mg | SUBLINGUAL_TABLET | SUBLINGUAL | Status: DC | PRN

## 2015-05-08 MED ORDER — DEXTROSE 5 % IV SOLN
500.0000 mg | INTRAVENOUS | Status: DC
  Administered 2015-05-08 – 2015-05-11 (×4): 500 mg via INTRAVENOUS
  Filled 2015-05-08 (×4): qty 500

## 2015-05-08 MED ORDER — ACETAMINOPHEN 325 MG PO TABS: 650.0000 mg | ORAL_TABLET | Freq: Four times a day (QID) | ORAL | Status: DC | PRN

## 2015-05-08 MED ORDER — FAMOTIDINE 20 MG PO TABS
20.0000 mg | ORAL_TABLET | Freq: Every day | ORAL | Status: DC
Start: 2015-05-08 — End: 2015-05-12
  Administered 2015-05-08 – 2015-05-11 (×4): 20 mg via ORAL
  Filled 2015-05-08 (×4): qty 1

## 2015-05-08 MED ORDER — DEXTROSE 5 % IV SOLN: 1.0000 g | INTRAVENOUS | Status: DC

## 2015-05-08 MED ORDER — SODIUM CHLORIDE 0.9 % IV SOLN
INTRAVENOUS | Status: DC
  Administered 2015-05-08 (×2): via INTRAVENOUS

## 2015-05-08 MED ORDER — LISINOPRIL 20 MG PO TABS
20.0000 mg | ORAL_TABLET | Freq: Every day | ORAL | Status: DC
  Administered 2015-05-08: 10:00:00 20 mg via ORAL
  Filled 2015-05-08: qty 1

## 2015-05-08 MED ORDER — CLARITHROMYCIN 500 MG PO TABS: 500.0000 mg | ORAL_TABLET | Freq: Two times a day (BID) | ORAL | Status: DC

## 2015-05-08 MED ORDER — METHOTREXATE 2.5 MG PO TABS
15.0000 mg | ORAL_TABLET | ORAL | Status: DC
Start: 2015-05-08 — End: 2015-05-12
  Administered 2015-05-11: 15 mg via ORAL
  Filled 2015-05-08 (×2): qty 6

## 2015-05-08 MED ORDER — TRAMADOL HCL 50 MG PO TABS
ORAL_TABLET | ORAL | Status: AC
  Administered 2015-05-08: 50 mg via ORAL
  Filled 2015-05-08: qty 1

## 2015-05-08 MED ORDER — DEXTROSE 5 % IV SOLN: 2.0000 g | INTRAVENOUS | Status: DC

## 2015-05-08 MED ORDER — MORPHINE SULFATE (PF) 2 MG/ML IV SOLN
2.0000 mg | Freq: Once | INTRAVENOUS | Status: AC
  Administered 2015-05-08: 2 mg via INTRAVENOUS
  Filled 2015-05-08: qty 1

## 2015-05-08 MED ORDER — DEXTROSE 5 % IV SOLN
2.0000 g | Freq: Once | INTRAVENOUS | Status: AC
  Administered 2015-05-08: 05:00:00 2 g via INTRAVENOUS
  Filled 2015-05-08: qty 2

## 2015-05-08 MED ORDER — HYDROCHLOROTHIAZIDE 25 MG PO TABS
25.0000 mg | ORAL_TABLET | Freq: Every day | ORAL | Status: DC
  Administered 2015-05-08: 10:00:00 25 mg via ORAL
  Filled 2015-05-08: qty 1

## 2015-05-08 MED ORDER — ENOXAPARIN SODIUM 30 MG/0.3ML ~~LOC~~ SOLN: 30.0000 mg | SUBCUTANEOUS | Status: DC

## 2015-05-08 MED ORDER — TRAMADOL HCL 50 MG PO TABS
50.0000 mg | ORAL_TABLET | Freq: Three times a day (TID) | ORAL | Status: DC | PRN
  Administered 2015-05-08 – 2015-05-10 (×5): 50 mg via ORAL
  Filled 2015-05-08 (×4): qty 1

## 2015-05-08 NOTE — Plan of Care (Signed)
Called dr Vianne Bulls b/c blood pressure in L arm and R arm were low and lisinopril and hydrochlorothiazide are due to be given - she sd to proceed w/administration.
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
## 2015-05-08 NOTE — Plan of Care (Signed)
Spoke to Dr. Beverlee Nims hx of basal cell carcinoma - Saw dr at [facility] for surgery/radiation.  Last radiation was May 2015.  Pt doesn't want any more tx.  Doesn't make saliva and has affected taste buds.  Seeing [facility] in [place] for facial wound.  Saw Wound Care Nurse today and she's put in instructions while here.  Oncology consult ordered here and will have CT of maxillary tomorrow.    Pt taking methotrexate for rheumatoid arthritis.  Sees Dr. Cristi Loron.   Pt will have PT eval.  Concerned about recent falls - one Christmas Eve that resulted in R rib fractures and R scapula fracture.  Pt fell last night at home b/c of dizziness.  Pt's BP is low. Called dr and was instructed to give BP meds - she has since d/ced them.   Pt c/o chest pain.  Tramadol ineffective and morphine given 1x. Elevated troponin of 0.04.

## 2015-05-08 NOTE — Progress Notes (Signed)
ANTIBIOTIC CONSULT NOTE - INITIAL   Pharmacy Consult for ceftriaxone dosing Indication: pneumonia  Allergies  Allergen Reactions  . Acetaminophen-Codeine Other (See Comments)    Crazy dreams  . Albuterol Other (See Comments)    trembling, lung infection(?)  . Penicillins Swelling and Other (See Comments)    Has patient had a PCN reaction causing immediate rash, facial/tongue/throat swelling, SOB or lightheadedness with hypotension: No Has patient had a PCN reaction causing severe rash involving mucus membranes or skin necrosis: No Has patient had a PCN reaction that required hospitalization No Has patient had a PCN reaction occurring within the last 10 years: No If all of the above answers are "NO", then may proceed with Cephalosporin use.    Patient Measurements: Height: 6' (182.9 cm) Weight: 133 lb 12.8 oz (60.691 kg) (bed scales) IBW/kg (Calculated) : 77.6 Adjusted Body Weight: n/a  Vital Signs: Temp: 97.3 F (36.3 C) (01/09 2005) Temp Source: Oral (01/09 2005) BP: 113/73 mmHg (01/10 0130) Pulse Rate: 106 (01/10 0130) Intake/Output from previous day:   Intake/Output from this shift:    Labs:  Recent Labs  05/07/15 2126  WBC 24.3*  HGB 12.7*  PLT 259  CREATININE 1.63*   Estimated Creatinine Clearance: 26.9 mL/min (by C-G formula based on Cr of 1.63). No results for input(s): VANCOTROUGH, VANCOPEAK, VANCORANDOM, GENTTROUGH, GENTPEAK, GENTRANDOM, TOBRATROUGH, TOBRAPEAK, TOBRARND, AMIKACINPEAK, AMIKACINTROU, AMIKACIN in the last 72 hours.   Microbiology: No results found for this or any previous visit (from the past 720 hour(s)).  Medical History: Past Medical History  Diagnosis Date  . GERD (gastroesophageal reflux disease)   . Hypertension   . COPD (chronic obstructive pulmonary disease) ([place])   . Basal cell carcinoma   . Rheumatoid arthritis (HCC)     Medications:   Assessment: Blood and sputum cx pending CXR: possible atypical infection CT chest:  possible peumonia  Goal of Therapy:  Resolve infection  Plan:  Ceftriaxone 2 grams IV q 24 hours ordered.   Ovella Manygoats S 05/08/2015,2:39 AM
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
## 2015-05-08 NOTE — Consult Note (Signed)
WOC wound consult note Reason for Consult: Basal Cell carcinoma (per chart) to right maxilla, just below ear.  Patient states this began as a large boil and burst open and he has not seen a doctor regarding this.  History of carcinoma noted in chart. This area is extremely painful and he is reluctant to allow assessment.  Wound type: Carcinoma Pressure Ulcer POA: N/A Measurement: 8 cm x 4 cm x 3 cm (resisted assessment, but no tunneling noted) Wound bed: pus, pale pink nongranulating tissue, visible facial tendons Drainage (amount, consistency, odor) Moderate drainage  Musty odor. Periwound:Intact Dressing procedure/placement/frequency: Cleanse wound to right side face gently and pat dry.  Gently fill wound with calcium alginate. Cover with ABD pad and tape.  Change every other day.  Begin 05/08/14. Will not follow at this time.  Please re-consult if needed.  Domenic Moras RN BSN [place] Pager [phone]
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
## 2015-05-08 NOTE — Care Management (Signed)
Admitted to [facility] with the diagnosis of community acquired pneumonia. States he seen Dr. Otilio Miu last summer. States he usually lives by himself, but his sister is living with now. Couldn't tell me the sister's name.  Daughter is [name] [phone]). History of falls. Will request physical therapy evaluation. WBC's 14.3. IV Rocephin and Rocephin continue. Shelbie Ammons RN MSN CCM Care Management [phone]

## 2015-05-08 NOTE — Evaluation (Signed)
Physical Therapy Evaluation Patient Details Name: [name] MRN: [id number] DOB: Jun 18, 1924 Today's Date: 05/08/2015   History of Present Illness  [name] is a 82 y.o. male with a known history of basal cell carcinoma skin, GERD, hypertension, rheumatoid arthritis presented to the emergency room after sustaining a fall. Patient felt dizzy and lost balance and fell down. No history of any head injury. Patient usually lives alone at home but recently his daughter moved in after Christmas Eve. He also had a fall during Christmas Eve. Patient complains of cough for the last couple of days which is productive of yellowish phlegm. He coughs he has some chest discomfort. of an. No history of any sick contacts at home.Had low grade fever. Had an extensive surgery for the basal cell cancer skin on the right side of the face. Patient was worked up with a CT chest which showed pneumonia. Patient is on oral steroids as outpatient. Imaging revealed multiple R posterior rib fractures as well as fracture at the base of corocoid process of R scapula. Pt reports 3 falls over the last 12 months  Clinical Impression  Pt reports significant increase in R flank and bilateral chest pain with mobility which limits his ability to ambulate further on this date. He is able to perform standing bed to chair transfer but will not tolerate sitting up in recliner and immediately requests return to bed. Pt is able to complete LE bed exercises but reports intermittent increase in pain. He demonstrates SaO2 >90% on room air during all exercise. Pt with increased RR during mobility due to increased pain. Pt will need SNF placement at discharge at this point. Discharge recommendations will be updated as needed pending further mobility and improved pain control. Pt will benefit from skilled PT services to address deficits in strength, balance, and mobility in order to return to full function at home.     Follow Up  Recommendations SNF    Equipment Recommendations  None recommended by PT    Recommendations for Other Services       Precautions / Restrictions Precautions Precautions: Fall Restrictions Weight Bearing Restrictions: No      Mobility  Bed Mobility Overal bed mobility: Needs Assistance Bed Mobility: Supine to Sit;Sit to Supine     Supine to sit: Mod assist;+2 for physical assistance Sit to supine: Mod assist;+2 for physical assistance   General bed mobility comments: Pt primarily limited by pain at this time. Requires modA+2 for bed mobility as he is only able to provide limited reaching/pulling with RUE secondary to pain.  Transfers Overall transfer level: Needs assistance Equipment used: Rolling walker (2 wheeled) Transfers: Sit to/from Stand Sit to Stand: Min assist;+2 physical assistance         General transfer comment: Pt with increased pain during transfers. Attempts to pull up on walker but good anterior weight shifting demonstrated. Increased pain reported during transfers. Pt able to take a few small steps from bed to recliner but no true ambulation due to increased pain  Ambulation/Gait             General Gait Details: Bed to chair but unable to attempt further ambulation due to pain  Stairs            Wheelchair Mobility    Modified Rankin (Stroke Patients Only)       Balance Overall balance assessment: Needs assistance Sitting-balance support: No upper extremity supported Sitting balance-Leahy Scale: Fair     Standing balance support: No  upper extremity supported Standing balance-Leahy Scale: Fair Standing balance comment: Able to maintain standing with CGA only for safety. Pt with minimal tolerance due to pain                             Pertinent Vitals/Pain Pain Assessment: 0-10 Pain Score: 3  (Pain increases but not rated with mobility) Pain Location: Bilateral mid/upper chest Pain Intervention(s): Monitored  during session;Limited activity within patient's tolerance;Patient requesting pain meds-RN notified    Home Living Family/patient expects to be discharged to:: Private residence Living Arrangements: Children Available Help at Discharge: Family Type of Home: Mobile home Home Access: Stairs to enter Entrance Stairs-Rails: None Entrance Stairs-Number of Steps: 4 Home Layout: One level Home Equipment: Environmental consultant - 2 wheels;Cane - single point      Prior Function Level of Independence: Needs assistance   Gait / Transfers Assistance Needed: Use of single point cane intermittently.   ADL's / Homemaking Assistance Needed: Daughter has been assisting with bathing/dressing        Hand Dominance   Dominant Hand: Right    Extremity/Trunk Assessment   Upper Extremity Assessment:  (Difficult to assess due to pain)           Lower Extremity Assessment: Overall WFL for tasks assessed         Communication   Communication: HOH  Cognition Arousal/Alertness: Awake/alert Behavior During Therapy: WFL for tasks assessed/performed Overall Cognitive Status: No family/caregiver present to determine baseline cognitive functioning       Memory: Decreased short-term memory (AOx2, able to provide DOB)              General Comments      Exercises General Exercises - Lower Extremity Ankle Circles/Pumps: Strengthening;Both;10 reps;Supine Quad Sets: Strengthening;Both;10 reps;Supine Gluteal Sets: Strengthening;Both;10 reps;Supine Heel Slides: Strengthening;Both;10 reps;Supine Hip ABduction/ADduction: Strengthening;Both;10 reps;Supine Straight Leg Raises: Strengthening;Both;10 reps;Supine      Assessment/Plan    PT Assessment Patient needs continued PT services  PT Diagnosis Difficulty walking;Abnormality of gait;Generalized weakness;Acute pain   PT Problem List Decreased activity tolerance;Decreased balance;Decreased safety awareness;Pain  PT Treatment Interventions DME  instruction;Gait training;Stair training;Therapeutic activities;Therapeutic exercise;Balance training;Neuromuscular re-education;Patient/family education   PT Goals (Current goals can be found in the Care Plan section) Acute Rehab PT Goals Patient Stated Goal: To decrease pain and return home PT Goal Formulation: With patient Time For Goal Achievement: 05/22/15 Potential to Achieve Goals: Good    Frequency 7X/week   Barriers to discharge Decreased caregiver support Daughter staying with patient but "going back at some point."    Co-evaluation               End of Session Equipment Utilized During Treatment: Gait belt Activity Tolerance: Patient limited by pain Patient left: in chair;with call bell/phone within reach;with chair alarm set Nurse Communication: Mobility status         Time: 1515-1550 PT Time Calculation (min) (ACUTE ONLY): 35 min   Charges:   PT Evaluation $PT Eval High Complexity: 1 Procedure PT Treatments $Therapeutic Exercise: 8-22 mins   PT G Codes:       Lyndel Safe Lauri Purdum PT, DPT   Azariah Latendresse 05/08/2015, 4:05 PM

## 2015-05-08 NOTE — Progress Notes (Signed)
ANTIBIOTIC CONSULT NOTE - INITIAL  Pharmacy Consult for meropenem Indication: facial wound (and CAP)  Allergies  Allergen Reactions  . Acetaminophen-Codeine Other (See Comments)    Crazy dreams  . Albuterol Other (See Comments)    trembling, lung infection(?)  . Penicillins Swelling and Other (See Comments)    Has patient had a PCN reaction causing immediate rash, facial/tongue/throat swelling, SOB or lightheadedness with hypotension: No Has patient had a PCN reaction causing severe rash involving mucus membranes or skin necrosis: No Has patient had a PCN reaction that required hospitalization No Has patient had a PCN reaction occurring within the last 10 years: No If all of the above answers are "NO", then may proceed with Cephalosporin use.    Patient Measurements: Height: 6' (182.9 cm) Weight: 133 lb (60.328 kg) IBW/kg (Calculated) : 77.6   Vital Signs: Temp: 97.6 F (36.4 C) (01/10 1417) Temp Source: Oral (01/10 1417) BP: 107/62 mmHg (01/10 1417) Pulse Rate: 95 (01/10 1417) Intake/Output from previous day:   Intake/Output from this shift: Total I/O In: -  Out: 300 [Urine:300]  Labs:  Recent Labs  05/07/15 2126 05/08/15 0446  WBC 24.3* 14.3*  HGB 12.7* 12.0*  PLT 259 205  CREATININE 1.63* 1.36*   Estimated Creatinine Clearance: 32 mL/min (by C-G formula based on Cr of 1.36). No results for input(s): VANCOTROUGH, VANCOPEAK, VANCORANDOM, GENTTROUGH, GENTPEAK, GENTRANDOM, TOBRATROUGH, TOBRAPEAK, TOBRARND, AMIKACINPEAK, AMIKACINTROU, AMIKACIN in the last 72 hours.   Microbiology: Recent Results (from the past 720 hour(s))  Culture, blood (Routine X 2) w Reflex to ID Panel     Status: None (Preliminary result)   Collection Time: 05/07/15 11:12 PM  Result Value Ref Range Status   Specimen Description BLOOD LEFT ANTECUBITAL  Final   Special Requests BOTTLES DRAWN AEROBIC AND ANAEROBIC 10ML  Final   Culture NO GROWTH < 12 HOURS  Final   Report Status PENDING   Incomplete  Culture, blood (Routine X 2) w Reflex to ID Panel     Status: None (Preliminary result)   Collection Time: 05/07/15 11:15 PM  Result Value Ref Range Status   Specimen Description BLOOD RIGHT WRIST  Final   Special Requests BOTTLES DRAWN AEROBIC AND ANAEROBIC 5ML  Final   Culture NO GROWTH < 12 HOURS  Final   Report Status PENDING  Incomplete    Medical History: Past Medical History  Diagnosis Date  . GERD (gastroesophageal reflux disease)   . Hypertension   . COPD (chronic obstructive pulmonary disease) ([place])   . Basal cell carcinoma   . Rheumatoid arthritis [facility])      Assessment: 82 yo male with hx of skin cancer and currently being treated for CAP with ceftriaxone and azithromycin. MD wants to change ceftriaxone to meropenem to cover for pseudomonas.   Goal of Therapy:  Resolution of infection  Plan:  Will order meropenem 1 g IV q12h based on current renal function.   Pharmacy will continue to follow.   Rayna Sexton L 05/08/2015,2:53 PM

## 2015-05-09 ENCOUNTER — Inpatient Hospital Stay: Payer: Medicare Other

## 2015-05-09 LAB — CBC
HCT: 36 % — ABNORMAL LOW (ref 40.0–52.0)
Hemoglobin: 11.7 g/dL — ABNORMAL LOW (ref 13.0–18.0)
MCH: 28.9 pg (ref 26.0–34.0)
MCHC: 32.6 g/dL (ref 32.0–36.0)
MCV: 88.7 fL (ref 80.0–100.0)
Platelets: 185 K/uL (ref 150–440)
RBC: 4.05 MIL/uL — ABNORMAL LOW (ref 4.40–5.90)
RDW: 16.9 % — ABNORMAL HIGH (ref 11.5–14.5)
WBC: 12.2 K/uL — ABNORMAL HIGH (ref 3.8–10.6)

## 2015-05-09 LAB — BASIC METABOLIC PANEL
Anion gap: 4 — ABNORMAL LOW (ref 5–15)
BUN: 27 mg/dL — AB (ref 6–20)
CHLORIDE: 102 mmol/L (ref 101–111)
CO2: 26 mmol/L (ref 22–32)
CREATININE: 0.98 mg/dL (ref 0.61–1.24)
Calcium: 8 mg/dL — ABNORMAL LOW (ref 8.9–10.3)
GFR calc non Af Amer: >60 mL/min (ref >60)
Glucose, Bld: 103 mg/dL — ABNORMAL HIGH (ref 65–99)
Potassium: 4.3 mmol/L (ref 3.5–5.1)
Sodium: 132 mmol/L — ABNORMAL LOW (ref 135–145)

## 2015-05-09 MED ORDER — VANCOMYCIN HCL IN DEXTROSE 1-5 GM/200ML-% IV SOLN
1000.0000 mg | Freq: Once | INTRAVENOUS | Status: DC
  Filled 2015-05-09: qty 200

## 2015-05-09 MED ORDER — VANCOMYCIN HCL IN DEXTROSE 750-5 MG/150ML-% IV SOLN
750.0000 mg | INTRAVENOUS | Status: DC
  Administered 2015-05-10 – 2015-05-11 (×3): 750 mg via INTRAVENOUS
  Filled 2015-05-09 (×4): qty 150

## 2015-05-09 MED ORDER — IOHEXOL 300 MG/ML  SOLN
75.0000 mL | Freq: Once | INTRAMUSCULAR | Status: AC | PRN
  Administered 2015-05-09: 75 mL via INTRAVENOUS

## 2015-05-09 MED ORDER — VANCOMYCIN HCL IN DEXTROSE 1-5 GM/200ML-% IV SOLN
1000.0000 mg | INTRAVENOUS | Status: AC
  Administered 2015-05-09: 15:00:00 1000 mg via INTRAVENOUS
  Filled 2015-05-09: qty 200

## 2015-05-09 MED ORDER — NICOTINE 21 MG/24HR TD PT24
21.0000 mg | MEDICATED_PATCH | Freq: Every day | TRANSDERMAL | Status: DC
  Administered 2015-05-09 – 2015-05-11 (×2): 21 mg via TRANSDERMAL
  Filled 2015-05-09 (×3): qty 1

## 2015-05-09 MED ORDER — VANCOMYCIN HCL IN DEXTROSE 1-5 GM/200ML-% IV SOLN
1000.0000 mg | INTRAVENOUS | Status: DC
  Filled 2015-05-09: qty 200

## 2015-05-09 NOTE — Progress Notes (Signed)
ANTIBIOTIC CONSULT NOTE - INITIAL  Pharmacy Consult for Vancomycin Indication: sepsis  Allergies  Allergen Reactions  . Acetaminophen-Codeine Other (See Comments)    Crazy dreams  . Albuterol Other (See Comments)    trembling, lung infection(?)  . Penicillins Swelling and Other (See Comments)    Has patient had a PCN reaction causing immediate rash, facial/tongue/throat swelling, SOB or lightheadedness with hypotension: No Has patient had a PCN reaction causing severe rash involving mucus membranes or skin necrosis: No Has patient had a PCN reaction that required hospitalization No Has patient had a PCN reaction occurring within the last 10 years: No If all of the above answers are "NO", then may proceed with Cephalosporin use.    Patient Measurements: Height: 6' (182.9 cm) Weight: 133 lb (60.328 kg) IBW/kg (Calculated) : 77.6 Adjusted Body Weight:   Vital Signs: Temp: 97.8 F (36.6 C) (01/11 1300) Temp Source: Oral (01/11 1300) BP: 95/60 mmHg (01/11 1300) Pulse Rate: 106 (01/11 1300) Intake/Output from previous day: 01/10 0701 - 01/11 0700 In: -  Out: 1175 [Urine:1175] Intake/Output from this shift: Total I/O In: 420 [P.O.:120; IV Piggyback:300] Out: 200 [Urine:200]  Labs:  Recent Labs  05/07/15 2126 05/08/15 0446 05/09/15 0634  WBC 24.3* 14.3* 12.2*  HGB 12.7* 12.0* 11.7*  PLT 259 205 185  CREATININE 1.63* 1.36* 0.98   Estimated Creatinine Clearance: 44.4 mL/min (by C-G formula based on Cr of 0.98). No results for input(s): VANCOTROUGH, VANCOPEAK, VANCORANDOM, GENTTROUGH, GENTPEAK, GENTRANDOM, TOBRATROUGH, TOBRAPEAK, TOBRARND, AMIKACINPEAK, AMIKACINTROU, AMIKACIN in the last 72 hours.   Microbiology: Recent Results (from the past 720 hour(s))  Culture, blood (Routine X 2) w Reflex to ID Panel     Status: None (Preliminary result)   Collection Time: 05/07/15 11:12 PM  Result Value Ref Range Status   Specimen Description BLOOD LEFT ANTECUBITAL  Final   Special Requests BOTTLES DRAWN AEROBIC AND ANAEROBIC 10ML  Final   Culture NO GROWTH < 12 HOURS  Final   Report Status PENDING  Incomplete  Culture, blood (Routine X 2) w Reflex to ID Panel     Status: None (Preliminary result)   Collection Time: 05/07/15 11:15 PM  Result Value Ref Range Status   Specimen Description BLOOD RIGHT WRIST  Final   Special Requests BOTTLES DRAWN AEROBIC AND ANAEROBIC 5ML  Final   Culture NO GROWTH < 12 HOURS  Final   Report Status PENDING  Incomplete    Medical History: Past Medical History  Diagnosis Date  . GERD (gastroesophageal reflux disease)   . Hypertension   . COPD (chronic obstructive pulmonary disease) ([place])   . Basal cell carcinoma   . Rheumatoid arthritis ([place])     Medications:  Prescriptions prior to admission  Medication Sig Dispense Refill Last Dose  . budesonide-formoterol (SYMBICORT) 160-4.5 MCG/ACT inhaler Inhale 2 puffs into the lungs 2 (two) times daily. 1 Inhaler 6 05/07/2015 at Unknown time  . folic acid (FOLVITE) 1 MG tablet Take 2 tablets by mouth daily.   05/07/2015 at Unknown time  . lisinopril-hydrochlorothiazide (PRINZIDE,ZESTORETIC) 20-25 MG per tablet Take 1 tablet by mouth daily. 30 tablet 5 05/07/2015 at Unknown time  . methotrexate (RHEUMATREX) 2.5 MG tablet Take 6 tablets by mouth once a week.   Past Week at Unknown time  . predniSONE (DELTASONE) 5 MG tablet Take 1 tablet by mouth daily.   05/07/2015 at Unknown time  . ranitidine (ZANTAC) 150 MG tablet Take 1 tablet (150 mg total) by mouth daily. 30 tablet 5 05/07/2015  at Unknown time  . tamsulosin (FLOMAX) 0.4 MG CAPS capsule Take 1 capsule by mouth daily.   05/07/2015 at Unknown time  . tiotropium (SPIRIVA HANDIHALER) 18 MCG inhalation capsule Place 1 capsule (18 mcg total) into inhaler and inhale daily at 6 (six) AM. 30 capsule 5 05/07/2015 at Unknown time  . traMADol (ULTRAM) 50 MG tablet Take 1 tablet by mouth 3 (three) times daily as needed for moderate pain.    prn at prn  .  acetaminophen-codeine (TYLENOL #3) 300-30 MG tablet Take 1 tablet by mouth every 6 (six) hours as needed for moderate pain. (Patient not taking: Reported on 05/08/2015) 6 tablet 0 Not Taking at Unknown time   Scheduled:  . azithromycin  500 mg Intravenous Q24H  . budesonide-formoterol  2 puff Inhalation BID  . enoxaparin (LOVENOX) injection  40 mg Subcutaneous Q24H  . famotidine  20 mg Oral Daily  . feeding supplement (ENSURE ENLIVE)  237 mL Oral TID WC  . folic acid  2 mg Oral Daily  . meropenem (MERREM) IV  1 g Intravenous Q12H  . methotrexate  15 mg Oral Weekly  . nicotine  21 mg Transdermal Daily  . predniSONE  5 mg Oral Daily  . tamsulosin  0.4 mg Oral Daily  . tiotropium  18 mcg Inhalation Q0600  . vancomycin  1,000 mg Intravenous STAT  . [START ON 05/10/2015] vancomycin  1,000 mg Intravenous Q18H   Assessment: Pharmacy consulted to dose and monitor Vancomycin in this 82 year old male being treated for sepsis secondary to abscess of neck   Kel (hr-1): 0.041 Half-life (hrs): 16.91 Vd (liters): 42.21 (factor used: 0.7 L/kg)   Goal of Therapy:  Vancomycin trough level 15-20 mcg/ml  Plan:  Expected duration 5 days with resolution of temperature and/or normalization of WBC   Will give Vancomycin 1 g IV x 1. Will order Vancomycin 750 mg IV q18 hours. Trough level ordered to be drawn prior to the 1/14.    Owen,Deondre Marinaro D 05/09/2015,3:07 PM

## 2015-05-09 NOTE — Progress Notes (Signed)
Palliative Care Progress Note- Met with patient and daughter for palliative care discussions. Patient appears to be agitated/ornery, however daughter reports this is his baseline. He is alert and oriented and able to participate in discussions. Discussed goals of care with patient who confirms that he does not want any further treatment for his skin cancer. Patient states that his goal is to be able to return to as much independence as possible and "get stronger". Discussed that PT has made recommendation that patient be discharged to SNF for rehab and patient is agreeable at this time. Daughter lives in [place] and patient lives in [place] and for these reasons, daughter would like to explore bed offers near or around [place]. Reviewed code status with patient and daughter to which patient states he does not wish to have chest compressions/ressucitation and wants to be a DNR. Care team updated of patient's expressed goals and desire for DNR, including Dr. Leslye Peer.   Atha Starks, MSW, LCSW Palliative Care Social Worker [phone]

## 2015-05-09 NOTE — Progress Notes (Signed)
Physical Therapy Treatment Patient Details Name: [name] MRN: [id number] DOB: 07-May-1924 Today's Date: 05/09/2015    History of Present Illness [name] is a 82 y.o. male with a known history of basal cell carcinoma skin, GERD, hypertension, rheumatoid arthritis presented to the emergency room after sustaining a fall. Patient felt dizzy and lost balance and fell down. No history of any head injury. Patient usually lives alone at home but recently his daughter moved in after Christmas Eve. He also had a fall during Christmas Eve. Patient complains of cough for the last couple of days which is productive of yellowish phlegm. He coughs he has some chest discomfort. of an. No history of any sick contacts at home.Had low grade fever. Had an extensive surgery for the basal cell cancer skin on the right side of the face. Patient was worked up with a CT chest which showed pneumonia. Patient is on oral steroids as outpatient. Imaging revealed multiple R posterior rib fractures as well as fracture at the base of corocoid process of R scapula. Pt reports 3 falls over the last 12 months    PT Comments    Pt demonstrates very mild improvement in mobility on this date although he is still significantly limited. He is able to take a few more steps but still very limited in ambulation distance. Pt reports bilateral chest pain with all mobility, transfers, and ambulation. Pt able to complete all bed exercises as instructed. He will need SNF placement at discharge to facilitate return to prior level of function. Pt will benefit from skilled PT services to address deficits in strength, balance, and mobility in order to return to full function at home.    Follow Up Recommendations  SNF     Equipment Recommendations  None recommended by PT    Recommendations for Other Services       Precautions / Restrictions Precautions Precautions: Fall Restrictions Weight Bearing Restrictions: No     Mobility  Bed Mobility Overal bed mobility: Needs Assistance Bed Mobility: Supine to Sit;Sit to Supine     Supine to sit: Mod assist;+2 for physical assistance Sit to supine: Mod assist;+2 for physical assistance   General bed mobility comments: Pt primarily limited by pain at this time. Requires modA+2 for bed mobility as he is only able to provide limited reaching/pulling with RUE secondary to pain.  Transfers Overall transfer level: Needs assistance Equipment used: Rolling walker (2 wheeled) Transfers: Sit to/from Stand Sit to Stand: Min assist;+2 safety/equipment         General transfer comment: Pt demonstrates less pain on this date during transfers. Good LE strength and sequencing during transfer. Continues to require cues for safe hand placement. Pt able to maintain upright posture without UE support as needed  Ambulation/Gait Ambulation/Gait assistance: Min assist Ambulation Distance (Feet): 6 Feet Assistive device: Rolling walker (2 wheeled) Gait Pattern/deviations: Step-to pattern Gait velocity: Decreased Gait velocity interpretation: Below normal speed for age/gender General Gait Details: Pt able to take some small steps to end of bed with rolling walker. Initially upon walking pt with posterior LOB requiring therapist correction to prevent fall. Pt reports increase in pain with stepping and is unable to ambulate further. Pt requires cues for turning and is somewhat impulsive during ambulation. O2 donned at 2L/min during ambulation and SaO2 remains >90%.   Stairs            Wheelchair Mobility    Modified Rankin (Stroke Patients Only)  Balance Overall balance assessment: Needs assistance Sitting-balance support: No upper extremity supported Sitting balance-Leahy Scale: Fair     Standing balance support: No upper extremity supported Standing balance-Leahy Scale: Fair Standing balance comment: Pt does have some posterior LOB once stepping  initiated                    Cognition Arousal/Alertness: Awake/alert Behavior During Therapy: Agitated Overall Cognitive Status: Difficult to assess       Memory: Decreased short-term memory              Exercises General Exercises - Lower Extremity Ankle Circles/Pumps: Strengthening;Both;10 reps;Supine Quad Sets: Strengthening;Both;10 reps;Supine Gluteal Sets: Strengthening;Both;10 reps;Supine Short Arc Quad: Strengthening;Both;10 reps;Supine Heel Slides: Strengthening;Both;10 reps;Supine Hip ABduction/ADduction: Strengthening;Both;10 reps;Supine Straight Leg Raises: Strengthening;Both;10 reps;Supine    General Comments        Pertinent Vitals/Pain Pain Assessment: Faces Faces Pain Scale: Hurts even more Pain Location: Bilateral chest Pain Intervention(s): Monitored during session;Premedicated before session    Home Living                      Prior Function            PT Goals (current goals can now be found in the care plan section) Acute Rehab PT Goals Patient Stated Goal: To decrease pain and return home PT Goal Formulation: With patient Time For Goal Achievement: 05/22/15 Potential to Achieve Goals: Good Progress towards PT goals: Progressing toward goals    Frequency  7X/week    PT Plan Current plan remains appropriate    Co-evaluation             End of Session Equipment Utilized During Treatment: Gait belt Activity Tolerance: Patient limited by pain Patient left: in bed;with bed alarm set;with call bell/phone within reach (Pt refused up to chair)     Time: 1115-1150 PT Time Calculation (min) (ACUTE ONLY): 35 min  Charges:  $Gait Training: 8-22 mins $Therapeutic Exercise: 8-22 mins                    G Codes:      Lyndel Safe Huprich PT, DPT   Huprich,Jason 05/09/2015, 12:13 PM

## 2015-05-09 NOTE — Progress Notes (Signed)
Palliative Care Update  Pt seen and examined.  Notes reviewed.  Family has gone home.    Will revisit tomorrow.  Full note to follow.  Colleen Can, MD
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
## 2015-05-09 NOTE — Consult Note (Signed)
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 cell opacification. No evidence of facial bone fracture. Numerous dental caries involving bilateral anterior maxillary teeth. There is extensive subcutaneous soft tissue abnormality on the right beginning just posterior to the external auditory canal and extending inferiorly to the level of the epiglottis. Cranially, the process extends to the posterior cortex of the mandibular condyles. There is extensive overlying soft tissue ulceration. The lesion is mildly hypo attenuating with mild rim and internal septation enhancement. At its greatest dimension, at the angle of the mandible, transverse diameter is 37 x 33 mm. Craniocaudal extent is 7.8 cm. Average attenuation value is 46. The mass largely replaces the anticipated parotid gland, and exerts post row medial mass effect on anticipated facial and vascularity. IMPRESSION: Large subcutaneous peripherally enhancing mass on the right likely representing known neoplasm. Differential diagnostic possibilities include complex necrotic fluid collection including abscess. There is a large overlying soft tissue defect. Electronically Signed   By: RSkipper ClicheM.D.   On: 05/09/2015 08:43  .    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 He has already had treatment with radiation and possibly chemotherapy (there is some reference to chemotherapy last spring in notes that were available). He has refused aggressive surgical intervention. The mass involves the right upper neck, sternocleidomastoid muscle, overlying skin, and effaces the internal jugular vein, possibly with focal invasion of the vein.  PLAN: There is no evidence of any acute infectious process involving the mass itself, and certainly no abscess to drain. Unfortunately I don't have anything to offer the patient in terms of treatment because of the extent of his disease. The patient also has refused extensive surgery in the past and continues to state that he would not want an extensive surgical resection. I would recommend continued wet-to-dry wound care for the ulcerative wound. Palliative care discussions would be appropriate, however if there is any interest by the family in seeking further surgical opinion, [facility] therapy would be appropriate. Unfortunately surgical resection would be at least as extensive as originally described, including radical neck dissection and need for a vascularized flap to close the defect.    BRiley Nearing MD 05/09/2015 5:39 PM

## 2015-05-09 NOTE — Clinical Documentation Improvement (Signed)
Hospitalist  Can the diagnosis of pneumonia be further specified?   Type of Pneumonia - CAP, HCAP, Bacterial Pneumonia, Aspiration Pneumonia, Gram Negative Pneumonia, Other Condition, Unable to Clinically Determine  Document causative organism if known  Document mechanism - Aspiration, Ventilator - Associated, Radiation Induced, Other (Specify)  Other condition  Clinically Undetermined   Supporting Information: :  Aspiration pneumonia of both lungs per 05/08/15 ED note. Community acquired pneumonia per 05/08/15 progress notes. Community acquired pneumonia listed per 05/08/15 problem list.   Please exercise your independent, professional judgment when responding. A specific answer is not anticipated or expected.   Thank You, [place] [phone]

## 2015-05-09 NOTE — Progress Notes (Signed)
   05/09/15 1500  Clinical Encounter Type  Visited With Patient  Visit Type Initial  Referral From Patient  Consult/Referral To Chaplain  Spiritual Encounters  Spiritual Needs Other (Comment)  Stress Factors  Patient Stress Factors Exhausted;Loss of control  Chaplain was passing by in the unit and patient called out to chaplain. Offered a listening ear, compassionate presence and assisted with contacting the nurse for him. Chaplain Matrice Herro A. Johnetta Sloniker Ext. [phone]

## 2015-05-09 NOTE — Plan of Care (Signed)
Pt is alert but confused, c/o pain in chest, admitted for pne and found to have broken ribs, treated with tramadol and morphine. Pt is up to bsc with maximal assist, generalized weakness, poor appetite, on 2 L oxygen, afebrile, vital signs stable, non productive occasional cough, daughter at bedside throughout first half of shift, nicotiene patch applied per pt request, pt is irritable at times and some what anxious after daughter left bedside, wound care performed, wound culture sent to lab, patient evaluate by ENT MD, IV fluids d/c, on IV antibiotics. Physical therapy worked with patient.

## 2015-05-09 NOTE — Plan of Care (Signed)
Problem: Education: Goal: Knowledge of [facility] Education information/materials will improve Outcome: Progressing Pt is alert, c/o pain in right chest. Morphine and tramadol given for pain with some effect.  Some confusion present during the night, pt reoriented and therapeutic presence given. Pt is hard of hearing.  Continue with NS at 50 ml/hr and IV antibiotics.  Problem: Safety: Goal: Ability to remain free from injury will improve Outcome: Progressing Pt is a high fall risk, calls out for help. Encouraged to use call button for help. Hourly rounding.  Pt is incontinent at times.

## 2015-05-10 MED ORDER — TEMAZEPAM 7.5 MG PO CAPS
7.5000 mg | ORAL_CAPSULE | Freq: Every day | ORAL | Status: DC
  Administered 2015-05-10 – 2015-05-11 (×2): 7.5 mg via ORAL
  Filled 2015-05-10 (×2): qty 1

## 2015-05-10 MED ORDER — CEFTRIAXONE SODIUM 1 G IJ SOLR
1.0000 g | INTRAMUSCULAR | Status: DC
  Administered 2015-05-10 – 2015-05-11 (×2): 1 g via INTRAVENOUS
  Filled 2015-05-10 (×2): qty 10

## 2015-05-10 MED ORDER — TRAMADOL HCL 50 MG PO TABS
100.0000 mg | ORAL_TABLET | Freq: Four times a day (QID) | ORAL | Status: DC | PRN
  Administered 2015-05-11: 100 mg via ORAL
  Filled 2015-05-10: qty 2

## 2015-05-10 NOTE — Progress Notes (Signed)
Physical Therapy Treatment Patient Details Name: [name] MRN: [id number] DOB: 11-07-1924 Today's Date: 05/10/2015    History of Present Illness [name] is a 82 y.o. male with a known history of basal cell carcinoma skin, GERD, hypertension, rheumatoid arthritis presented to the emergency room after sustaining a fall. Patient felt dizzy and lost balance and fell down. No history of any head injury. Patient usually lives alone at home but recently his daughter moved in after Christmas Eve. He also had a fall during Christmas Eve. Patient complains of cough for the last couple of days which is productive of yellowish phlegm. He coughs he has some chest discomfort. of an. No history of any sick contacts at home.Had low grade fever. Had an extensive surgery for the basal cell cancer skin on the right side of the face. Patient was worked up with a CT chest which showed pneumonia. Patient is on oral steroids as outpatient. Imaging revealed multiple R posterior rib fractures as well as fracture at the base of corocoid process of R scapula. Pt reports 3 falls over the last 12 months    PT Comments    Pt reports being unable to participate with PT today due to increased pain with the exception of bed exercises. Pt completes all bed exercises as instructed with minimal increase in pain. Will attempt transfers and ambulation on next date as pt is willing/appropriate. Pt will benefit from skilled PT services to address deficits in strength, balance, and mobility in order to return to full function at home.    Follow Up Recommendations  SNF     Equipment Recommendations  None recommended by PT    Recommendations for Other Services       Precautions / Restrictions Precautions Precautions: Fall Restrictions Weight Bearing Restrictions: No    Mobility  Bed Mobility               General bed mobility comments: Deferred at this time  Transfers                 General  transfer comment: Deferred  Ambulation/Gait             General Gait Details: Deferred   Stairs            Wheelchair Mobility    Modified Rankin (Stroke Patients Only)       Balance                                    Cognition Arousal/Alertness: Awake/alert Behavior During Therapy: Restless Overall Cognitive Status: Difficult to assess       Memory: Decreased short-term memory              Exercises General Exercises - Lower Extremity Ankle Circles/Pumps: Strengthening;Both;10 reps;Supine Quad Sets: Strengthening;Both;10 reps;Supine Gluteal Sets: Strengthening;Both;10 reps;Supine Short Arc Quad: Strengthening;Both;10 reps;Supine Heel Slides: Strengthening;Both;10 reps;Supine Hip ABduction/ADduction: Strengthening;Both;10 reps;Supine Straight Leg Raises: Strengthening;Both;10 reps;Supine    General Comments        Pertinent Vitals/Pain Pain Assessment: 0-10 Pain Score: 5  Pain Location: Bilateral upper/mid chest Pain Intervention(s): Monitored during session    Home Living                      Prior Function            PT Goals (current goals can now be found in the care plan section) Acute  Rehab PT Goals Patient Stated Goal: To decrease pain and return home PT Goal Formulation: With patient Time For Goal Achievement: 05/22/15 Potential to Achieve Goals: Good Progress towards PT goals: Progressing toward goals    Frequency  7X/week    PT Plan Current plan remains appropriate    Co-evaluation             End of Session Equipment Utilized During Treatment: Oxygen Activity Tolerance: Patient limited by pain Patient left: in bed;with bed alarm set;with call bell/phone within reach     Time: 1142-1152 PT Time Calculation (min) (ACUTE ONLY): 10 min  Charges:  $Therapeutic Exercise: 8-22 mins                    G Codes:      [name] [name] PT, DPT   [name] 05/10/2015, 11:59 AM

## 2015-05-10 NOTE — Clinical Social Work Placement (Signed)
   CLINICAL SOCIAL WORK PLACEMENT  NOTE  Date:  05/10/2015  Patient Details  Name: [name] MRN: [id number] Date of Birth: April 25, 1925  Clinical Social Work is seeking post-discharge placement for this patient at the [place] level of care (*CSW will initial, date and re-position this form in  chart as items are completed):  Yes   Patient/family provided with [facility] Work Department's list of facilities offering this level of care within the geographic area requested by the patient (or if unable, by the patient's family).  Yes   Patient/family informed of their freedom to choose among providers that offer the needed level of care, that participate in Medicare, Medicaid or managed care program needed by the patient, have an available bed and are willing to accept the patient.  Yes   Patient/family informed of [facility]'s ownership interest in [facility] and [facility], as well as of the fact that they are under no obligation to receive care at these facilities.  PASRR submitted to EDS on 05/10/15     PASRR number received on 05/10/15     Existing PASRR number confirmed on       FL2 transmitted to all facilities in geographic area requested by pt/family on 05/10/15     FL2 transmitted to all facilities within larger geographic area on       Patient informed that his/her managed care company has contracts with or will negotiate with certain facilities, including the following:            Patient/family informed of bed offers received.  Patient chooses bed at       Physician recommends and patient chooses bed at      Patient to be transferred to   on  .  Patient to be transferred to facility by       Patient family notified on   of transfer.  Name of family member notified:        PHYSICIAN       Additional Comment:    _______________________________________________ Darden Dates, LCSW 05/10/2015, 4:58 PM

## 2015-05-10 NOTE — Progress Notes (Signed)
Patient ID: [name], male   DOB: 1924/12/16, 82 y.o.   MRN: [id number] [facility] PROGRESS NOTE  PCP: Otilio Miu, MD  HPI/Subjective: Patient not feeling well at all. Right neck area has been draining for months. Chest pain constant for week worse when he moves around.  Objective: Filed Vitals:   05/10/15 0458 05/10/15 1324  BP: 125/77 103/65  Pulse: 104 104  Temp: 97.7 F (36.5 C) 97.7 F (36.5 C)  Resp: 20 20    Filed Weights   05/07/15 2005 05/08/15 0312  Weight: 60.691 kg (133 lb 12.8 oz) 60.328 kg (133 lb)    ROS: Review of Systems  Constitutional: Negative for fever and chills.  Eyes: Negative for blurred vision.  Respiratory: Positive for cough and shortness of breath.   Cardiovascular: Positive for chest pain.  Gastrointestinal: Negative for nausea, vomiting, abdominal pain, diarrhea and constipation.  Genitourinary: Negative for dysuria.  Musculoskeletal: Negative for joint pain.  Neurological: Negative for dizziness and headaches.   Exam: Physical Exam  Constitutional: He is oriented to person, place, and time.  HENT:  Nose: No mucosal edema.  Mouth/Throat: No oropharyngeal exudate or posterior oropharyngeal edema.  Eyes: Conjunctivae, EOM and lids are normal. Pupils are equal, round, and reactive to light.  Neck: No JVD present. Carotid bruit is not present. No edema present. No thyroid mass and no thyromegaly present.  Cardiovascular: S1 normal and S2 normal.  Exam reveals no gallop.   No murmur heard. Pulses:      Dorsalis pedis pulses are 2+ on the right side, and 2+ on the left side.  Respiratory: No respiratory distress. He has decreased breath sounds in the right lower field and the left lower field. He has no wheezes. He has no rhonchi. He has no rales.  GI: Soft. Bowel sounds are normal. There is no tenderness.  Musculoskeletal:       Right ankle: He exhibits no swelling.       Left ankle: He exhibits no swelling.   Lymphadenopathy:    He has no cervical adenopathy.  Neurological: He is alert and oriented to person, place, and time. No cranial nerve deficit.  Skin: Skin is warm. Nails show no clubbing.  Right neck with packing.  Psychiatric: He has a normal mood and affect.    Data Reviewed: Basic Metabolic Panel:  Recent Labs Lab 05/07/15 2126 05/08/15 0446 05/09/15 0634  NA 134* 135 132*  K 3.9 4.3 4.3  CL 101 104 102  CO2 25 26 26   GLUCOSE 127* 121* 103*  BUN 46* 41* 27*  CREATININE 1.63* 1.36* 0.98  CALCIUM 8.5* 8.1* 8.0*   Liver Function Tests:  Recent Labs Lab 05/07/15 2126  AST 23  ALT 22  ALKPHOS 79  BILITOT 0.5  PROT 6.4*  ALBUMIN 3.5   CBC:  Recent Labs Lab 05/07/15 2126 05/08/15 0446 05/09/15 0634  WBC 24.3* 14.3* 12.2*  NEUTROABS 23.4*  --   --   HGB 12.7* 12.0* 11.7*  HCT 39.5* 35.8* 36.0*  MCV 89.5 89.2 88.7  PLT 259 205 185   Cardiac Enzymes:  Recent Labs Lab 05/07/15 2126 05/08/15 0446 05/08/15 1041  TROPONINI 0.03 0.03 0.04*     Recent Results (from the past 240 hour(s))  Culture, blood (Routine X 2) w Reflex to ID Panel     Status: None (Preliminary result)   Collection Time: 05/07/15 11:12 PM  Result Value Ref Range Status   Specimen Description BLOOD LEFT ANTECUBITAL  Final   Special Requests BOTTLES DRAWN AEROBIC AND ANAEROBIC 10ML  Final   Culture NO GROWTH < 12 HOURS  Final   Report Status PENDING  Incomplete  Culture, blood (Routine X 2) w Reflex to ID Panel     Status: None (Preliminary result)   Collection Time: 05/07/15 11:15 PM  Result Value Ref Range Status   Specimen Description BLOOD RIGHT WRIST  Final   Special Requests BOTTLES DRAWN AEROBIC AND ANAEROBIC 5ML  Final   Culture NO GROWTH < 12 HOURS  Final   Report Status PENDING  Incomplete  Wound culture     Status: None (Preliminary result)   Collection Time: 05/09/15  2:30 PM  Result Value Ref Range Status   Specimen Description NECK right neck  Final   Special  Requests Immunocompromised  Final   Gram Stain PENDING  Incomplete   Culture HOLDING FOR POSSIBLE PATHOGEN  Final   Report Status PENDING  Incomplete     Studies: Ct Maxillofacial W/cm  05/09/2015  CLINICAL DATA:  Basal cell skin carcinoma right face, fell 04/21/15 with right face pain, facial wound right face related to radiation therapy EXAM: CT MAXILLOFACIAL WITH CONTRAST TECHNIQUE: Multidetector CT imaging of the maxillofacial structures was performed with intravenous contrast. Multiplanar CT image reconstructions were also generated. A small metallic BB was placed on the right temple in order to reliably differentiate right from left. CONTRAST:  50mL OMNIPAQUE IOHEXOL 300 MG/ML  SOLN COMPARISON:  None. FINDINGS: There is mild chronic inflammatory change left maxillary sinus. Small air-fluid levels in the sphenoid sinuses. Scattered right mastoid air cell opacification. No evidence of facial bone fracture. Numerous dental caries involving bilateral anterior maxillary teeth. There is extensive subcutaneous soft tissue abnormality on the right beginning just posterior to the external auditory canal and extending inferiorly to the level of the epiglottis. Cranially, the process extends to the posterior cortex of the mandibular condyles. There is extensive overlying soft tissue ulceration. The lesion is mildly hypo attenuating with mild rim and internal septation enhancement. At its greatest dimension, at the angle of the mandible, transverse diameter is 37 x 33 mm. Craniocaudal extent is 7.8 cm. Average attenuation value is 46. The mass largely replaces the anticipated parotid gland, and exerts post row medial mass effect on anticipated facial and vascularity. IMPRESSION: Large subcutaneous peripherally enhancing mass on the right likely representing known neoplasm. Differential diagnostic possibilities include complex necrotic fluid collection including abscess. There is a large overlying soft tissue  defect. Electronically Signed   By: Skipper Cliche M.D.   On: 05/09/2015 08:43    Scheduled Meds: . azithromycin  500 mg Intravenous Q24H  . budesonide-formoterol  2 puff Inhalation BID  . enoxaparin (LOVENOX) injection  40 mg Subcutaneous Q24H  . famotidine  20 mg Oral Daily  . feeding supplement (ENSURE ENLIVE)  237 mL Oral TID WC  . folic acid  2 mg Oral Daily  . meropenem (MERREM) IV  1 g Intravenous Q12H  . methotrexate  15 mg Oral Weekly  . nicotine  21 mg Transdermal Daily  . predniSONE  5 mg Oral Daily  . tamsulosin  0.4 mg Oral Daily  . tiotropium  18 mcg Inhalation Q0600  . vancomycin  750 mg Intravenous Q18H    Assessment/Plan:  1. Clinical sepsis present on admission, possible abscess right neck, pneumonia, tachycardia and leukocytosis. Add vancomycin, continue meropenem and Zithromax. ENT did not feel there was anything to be drained. I will get an infectious  disease consultation to help out with antibiotics and length of antibiotic therapy 2. 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 Has undergone radiation therapy. Patient with continued drainage from the area. Overall prognosis poor and a DO NOT RESUSCITATE at this point 3. Rib fractures and chest pain- when necessary pain medication 4. History of COPD 5. History of rheumatoid arthritis on prednisone chronically 6. BPH on Flomax  Code Status: Patient just made a DO NOT RESUSCITATE    Code Status Orders        Start     Ordered   05/08/15 0307  Full code   Continuous     05/08/15 0307    Code Status History    Date Active Date Inactive Code Status Order ID Comments User Context   This patient has a current code status but no historical code status.     Family Communication: We'll try and reach daughter Disposition Plan: Likely out to rehabilitation soon  Consultants:  ENT  Infectious disease  Antibiotics:  Zithromax  Meropenem  Vancomycin  Time  spent: 20 minutes  Loletha Grayer  [facility]

## 2015-05-10 NOTE — Plan of Care (Signed)
Pt is alert with confusion, c/o continuous pain improved with morphine, poor oral intake, receiving IV antibiotics, up to bsc, generalized weakness, vital signs stable, consulted with ID physician, wound culture pending, continues on 2 L oxygen. Pt is irritable at times. Daughters at bedside and updated on care.
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
## 2015-05-10 NOTE — Care Management Important Message (Signed)
Important Message  Patient Details  Name: [name] MRN: [id number] Date of Birth: Feb 06, 1925   Medicare Important Message Given:  Yes    Juliann Pulse A Lenyx Boody 05/10/2015, 10:29 AM

## 2015-05-10 NOTE — NC FL2 (Signed)
[place] LEVEL OF CARE SCREENING TOOL     IDENTIFICATION  Patient Name: [name] Birthdate: 12/07/1924 Sex: male Admission Date (Current Location): 05/07/2015  [place] and [place] Number:  Engineering geologist and Address:  [facility], [address] [place], [place] [address]      Provider Number: [id number]  Attending Physician Name and Address:  Loletha Grayer, MD  Relative Name and Phone Number:       Current Level of Care: Hospital Recommended Level of Care: [place] Prior Approval Number:    Date Approved/Denied:   PASRR Number:   [id number]   Discharge Plan: SNF    Current Diagnoses: Patient Active Problem List   Diagnosis Date Noted  . Community acquired pneumonia 05/08/2015  . Fall 05/08/2015  . Essential hypertension 01/09/2015  . Esophageal reflux 01/09/2015  . Centrilobular emphysema ([place]) 01/09/2015    Orientation RESPIRATION BLADDER Height & Weight    Self, Time, Situation, Place  O2 Continent 6' (182.9 cm) 133 lbs.  BEHAVIORAL SYMPTOMS/MOOD NEUROLOGICAL BOWEL NUTRITION STATUS      Continent Diet (Dys 3, thin liquids)  AMBULATORY STATUS COMMUNICATION OF NEEDS Skin   Extensive Assist   Normal                       Personal Care Assistance Level of Assistance  Bathing, Feeding, Dressing Bathing Assistance: Maximum assistance Feeding assistance: Maximum assistance Dressing Assistance: Maximum assistance     Functional Limitations Info  Sight, Hearing, Speech Sight Info: Impaired Hearing Info: Impaired Speech Info: Impaired    SPECIAL CARE FACTORS FREQUENCY  PT (By licensed PT)     PT Frequency: 5              Contractures      Additional Factors Info  Allergies, Code Status Code Status Info: DNR Allergies Info: Allergies: Acetaminophen-codeine, Albuterol, Penicillins           Current Medications (05/10/2015):  This is the current hospital active  medication list Current Facility-Administered Medications  Medication Dose Route Frequency Provider Last Rate Last Dose  . acetaminophen (TYLENOL) tablet 650 mg  650 mg Oral Q6H PRN Epifanio Lesches, MD      . azithromycin (ZITHROMAX) 500 mg in dextrose 5 % 250 mL IVPB  500 mg Intravenous Q24H Saundra Shelling, MD   500 mg at 05/10/15 0335  . budesonide-formoterol (SYMBICORT) 160-4.5 MCG/ACT inhaler 2 puff  2 puff Inhalation BID Saundra Shelling, MD   2 puff at 05/10/15 0741  . enoxaparin (LOVENOX) injection 40 mg  40 mg Subcutaneous Q24H Saundra Shelling, MD   40 mg at 05/09/15 2132  . famotidine (PEPCID) tablet 20 mg  20 mg Oral Daily Saundra Shelling, MD   20 mg at 05/10/15 0741  . feeding supplement (ENSURE ENLIVE) (ENSURE ENLIVE) liquid 237 mL  237 mL Oral TID WC Epifanio Lesches, MD   237 mL at 05/09/15 0800  . folic acid (FOLVITE) tablet 2 mg  2 mg Oral Daily Saundra Shelling, MD   2 mg at 05/10/15 0741  . meropenem (MERREM) 1 g in sodium chloride 0.9 % 100 mL IVPB  1 g Intravenous Q12H Epifanio Lesches, MD   1 g at 05/10/15 0741  . methotrexate (RHEUMATREX) tablet 15 mg  15 mg Oral Weekly Saundra Shelling, MD   15 mg at 05/08/15 1345  . morphine 2 MG/ML injection 2 mg  2 mg Intravenous Q4H PRN Saundra Shelling, MD  2 mg at 05/10/15 1453  . nicotine (NICODERM CQ - dosed in mg/24 hours) patch 21 mg  21 mg Transdermal Daily Loletha Grayer, MD   21 mg at 05/09/15 1424  . nitroGLYCERIN (NITROSTAT) SL tablet 0.4 mg  0.4 mg Sublingual Q5 min PRN Pavan Pyreddy, MD      . predniSONE (DELTASONE) tablet 5 mg  5 mg Oral Daily Saundra Shelling, MD   5 mg at 05/10/15 0741  . tamsulosin (FLOMAX) capsule 0.4 mg  0.4 mg Oral Daily Saundra Shelling, MD   0.4 mg at 05/10/15 0741  . temazepam (RESTORIL) capsule 7.5 mg  7.5 mg Oral QHS Loletha Grayer, MD      . tiotropium [facility]) inhalation capsule 18 mcg  18 mcg Inhalation Q0600 Saundra Shelling, MD   18 mcg at 05/10/15 0741  . traMADol (ULTRAM) tablet 100 mg  100 mg Oral  Q6H PRN Loletha Grayer, MD      . vancomycin (VANCOCIN) IVPB 750 mg/150 ml premix  750 mg Intravenous Q18H Loletha Grayer, MD   750 mg at 05/10/15 0101     Discharge Medications: Please see discharge summary for a list of discharge medications.  Relevant Imaging Results:  Relevant Lab Results:   Additional Information SSN: [id number]  Darden Dates, LCSW

## 2015-05-10 NOTE — Clinical Social Work Note (Signed)
Clinical Social Work Assessment  Patient Details  Name: [name] MRN: [id number] Date of Birth: 27-Oct-1924  Date of referral:  05/10/15               Reason for consult:  Facility Placement                Permission sought to share information with:  Family Supports Permission granted to share information::  Yes, Verbal Permission Granted  Name::     Daughters, [name] and [name]   Housing/Transportation Living arrangements for the past 2 months:  Single Family Home Source of Information:  Adult Children Patient Interpreter Needed:  None Criminal Activity/Legal Involvement Pertinent to Current Situation/Hospitalization:  No - Comment as needed Significant Relationships:  Adult Children Lives with:  Self Do you feel safe going back to the place where you live?  Yes Need for family participation in patient care:  Yes (Comment)  Care giving concerns:  No caregiving concerns identified.    Social Worker assessment / plan:  CSW met with pt with pt's daughter. CSW introduced herself and explained role of social work. CSW also explained the process of discharging to SNF. CSW explained the differences between Hospice and Rehab. Pt's daughter, [name] stated that she is not ready for Hospice yet. CSW intiated a bed search and family expressed interest in [place]. CSW will follow up with bed offers. CSW will continue to follow.   Employment status:  Retired Forensic scientist:  Medicare PT Recommendations:  [place] / Referral to community resources:  [place]  Patient/Family's Response to care:  Pt's family were appreciative of CSW support.   Patient/Family's Understanding of and Emotional Response to Diagnosis, Current Treatment, and Prognosis:  Pt's daughters understand that pt will need a higher level of care prior to returning home.   Emotional Assessment Appearance:  Appears stated age Attitude/Demeanor/Rapport:    (appropriate) Affect (typically observed):  Accepting Orientation:  Oriented to Self, Oriented to Place, Oriented to  Time, Oriented to Situation Alcohol / Substance use:  Never Used Psych involvement (Current and /or in the community):  No (Comment)  Discharge Needs  Concerns to be addressed:  Adjustment to Illness Readmission within the last 30 days:  No Current discharge risk:  None Barriers to Discharge:  Continued Medical Work up   [employer], LCSW 05/10/2015, 5:00 PM

## 2015-05-10 NOTE — Progress Notes (Signed)
Pharmacy Antibiotic Follow-up Note  [name] is a 82 y.o. year-old male admitted on 05/07/2015.  The patient is currently on day 1  of Vancomycin and day 2 of Merrem  for sepsis secondary to abscess of neck .  Assessment/Plan: This patient's current antibiotics will be continued without adjustments.   Continue current therapy for now. Will discuss with MD about duration of therapy.   Temp (24hrs), Avg:97.7 F (36.5 C), Min:97.5 F (36.4 C), Max:97.8 F (36.6 C)   Recent Labs Lab 05/07/15 2126 05/08/15 0446 05/09/15 0634  WBC 24.3* 14.3* 12.2*    Recent Labs Lab 05/07/15 2126 05/08/15 0446 05/09/15 0634  CREATININE 1.63* 1.36* 0.98   Estimated Creatinine Clearance: 44.4 mL/min (by C-G formula based on Cr of 0.98).    Allergies  Allergen Reactions  . Acetaminophen-Codeine Other (See Comments)    Crazy dreams  . Albuterol Other (See Comments)    trembling, lung infection(?)  . Penicillins Swelling and Other (See Comments)    Has patient had a PCN reaction causing immediate rash, facial/tongue/throat swelling, SOB or lightheadedness with hypotension: No Has patient had a PCN reaction causing severe rash involving mucus membranes or skin necrosis: No Has patient had a PCN reaction that required hospitalization No Has patient had a PCN reaction occurring within the last 10 years: No If all of the above answers are "NO", then may proceed with Cephalosporin use.    Antimicrobials this admission: Azithromycin  500 mg 1/10 >>  Merrem 1 g IV q12 hours  1/10 >>  Vancomycin 750 mg IV q18 hours 1/11 Levels/dose changes this admission:   Microbiology results:  BCx: NGTD Wound cx: NGTD       MRSA PCR:   Thank you for allowing pharmacy to be a part of this patient's care.  [place], [place] PharmD 05/10/2015 8:33 AM

## 2015-05-11 DIAGNOSIS — G319 Degenerative disease of nervous system, unspecified: Secondary | ICD-10-CM

## 2015-05-11 DIAGNOSIS — Z85828 Personal history of other malignant neoplasm of skin: Secondary | ICD-10-CM

## 2015-05-11 DIAGNOSIS — C4442 Squamous cell carcinoma of skin of scalp and neck: Secondary | ICD-10-CM

## 2015-05-11 DIAGNOSIS — J189 Pneumonia, unspecified organism: Secondary | ICD-10-CM

## 2015-05-11 DIAGNOSIS — K219 Gastro-esophageal reflux disease without esophagitis: Secondary | ICD-10-CM

## 2015-05-11 DIAGNOSIS — E43 Unspecified severe protein-calorie malnutrition: Secondary | ICD-10-CM | POA: Insufficient documentation

## 2015-05-11 DIAGNOSIS — I10 Essential (primary) hypertension: Secondary | ICD-10-CM

## 2015-05-11 DIAGNOSIS — E871 Hypo-osmolality and hyponatremia: Secondary | ICD-10-CM

## 2015-05-11 DIAGNOSIS — R079 Chest pain, unspecified: Secondary | ICD-10-CM

## 2015-05-11 DIAGNOSIS — M069 Rheumatoid arthritis, unspecified: Secondary | ICD-10-CM

## 2015-05-11 DIAGNOSIS — E46 Unspecified protein-calorie malnutrition: Secondary | ICD-10-CM

## 2015-05-11 DIAGNOSIS — G9341 Metabolic encephalopathy: Secondary | ICD-10-CM

## 2015-05-11 DIAGNOSIS — Z515 Encounter for palliative care: Secondary | ICD-10-CM

## 2015-05-11 DIAGNOSIS — J449 Chronic obstructive pulmonary disease, unspecified: Secondary | ICD-10-CM

## 2015-05-11 DIAGNOSIS — Z87891 Personal history of nicotine dependence: Secondary | ICD-10-CM

## 2015-05-11 DIAGNOSIS — D649 Anemia, unspecified: Secondary | ICD-10-CM

## 2015-05-11 DIAGNOSIS — A419 Sepsis, unspecified organism: Principal | ICD-10-CM

## 2015-05-11 DIAGNOSIS — S2249XS Multiple fractures of ribs, unspecified side, sequela: Secondary | ICD-10-CM

## 2015-05-11 DIAGNOSIS — Z923 Personal history of irradiation: Secondary | ICD-10-CM

## 2015-05-11 DIAGNOSIS — K5900 Constipation, unspecified: Secondary | ICD-10-CM

## 2015-05-11 DIAGNOSIS — Z66 Do not resuscitate: Secondary | ICD-10-CM

## 2015-05-11 DIAGNOSIS — J439 Emphysema, unspecified: Secondary | ICD-10-CM

## 2015-05-11 DIAGNOSIS — N400 Enlarged prostate without lower urinary tract symptoms: Secondary | ICD-10-CM

## 2015-05-11 LAB — BASIC METABOLIC PANEL
ANION GAP: 9 (ref 5–15)
BUN: 21 mg/dL — ABNORMAL HIGH (ref 6–20)
CHLORIDE: 88 mmol/L — AB (ref 101–111)
CO2: 28 mmol/L (ref 22–32)
Calcium: 8.1 mg/dL — ABNORMAL LOW (ref 8.9–10.3)
Creatinine, Ser: 0.86 mg/dL (ref 0.61–1.24)
GFR calc non Af Amer: >60 mL/min (ref >60)
Glucose, Bld: 102 mg/dL — ABNORMAL HIGH (ref 65–99)
POTASSIUM: 4.1 mmol/L (ref 3.5–5.1)
SODIUM: 125 mmol/L — AB (ref 135–145)

## 2015-05-11 LAB — CBC
HEMATOCRIT: 35.4 % — AB (ref 40.0–52.0)
HEMOGLOBIN: 11.9 g/dL — AB (ref 13.0–18.0)
MCH: 29.7 pg (ref 26.0–34.0)
MCHC: 33.6 g/dL (ref 32.0–36.0)
MCV: 88.4 fL (ref 80.0–100.0)
PLATELETS: 175 10*3/uL (ref 150–440)
RBC: 4.01 MIL/uL — AB (ref 4.40–5.90)
RDW: 16.5 % — AB (ref 11.5–14.5)
WBC: 8.7 10*3/uL (ref 3.8–10.6)

## 2015-05-11 MED ORDER — MORPHINE SULFATE (CONCENTRATE) 10 MG/0.5ML PO SOLN: 10.0000 mg | ORAL | Status: DC | PRN

## 2015-05-11 MED ORDER — ONDANSETRON HCL 4 MG/2ML IJ SOLN
4.0000 mg | INTRAMUSCULAR | Status: DC | PRN
  Administered 2015-05-11: 4 mg via INTRAVENOUS
  Filled 2015-05-11: qty 2

## 2015-05-11 MED ORDER — METRONIDAZOLE 500 MG PO TABS: ORAL_TABLET | ORAL | Status: AC

## 2015-05-11 MED ORDER — MORPHINE SULFATE (CONCENTRATE) 10 MG/0.5ML PO SOLN: 10.0000 mg | ORAL | Status: AC | PRN

## 2015-05-11 MED ORDER — TEMAZEPAM 7.5 MG PO CAPS: 7.5000 mg | ORAL_CAPSULE | Freq: Every evening | ORAL | Status: AC | PRN

## 2015-05-11 MED ORDER — POLYETHYLENE GLYCOL 3350 17 G PO PACK
17.0000 g | PACK | Freq: Every day | ORAL | Status: DC
  Administered 2015-05-11: 19:00:00 17 g via ORAL
  Filled 2015-05-11: qty 1

## 2015-05-11 MED ORDER — CEPHALEXIN 500 MG PO CAPS: ORAL_CAPSULE | ORAL | Status: AC

## 2015-05-11 MED ORDER — TRAMADOL HCL 50 MG PO TABS: 50.0000 mg | ORAL_TABLET | Freq: Four times a day (QID) | ORAL | Status: AC | PRN

## 2015-05-11 MED ORDER — POLYETHYLENE GLYCOL 3350 17 G PO PACK: 17.0000 g | PACK | Freq: Every day | ORAL | Status: AC

## 2015-05-11 MED ORDER — NICOTINE 21 MG/24HR TD PT24: 21.0000 mg | MEDICATED_PATCH | Freq: Every day | TRANSDERMAL | Status: AC

## 2015-05-11 MED ORDER — ACETAMINOPHEN 325 MG PO TABS: 650.0000 mg | ORAL_TABLET | Freq: Four times a day (QID) | ORAL | Status: AC | PRN

## 2015-05-11 NOTE — Progress Notes (Addendum)
Palliative Care Update  Note that I have discussed the patient with Dr Weiting and have again reviewed notes.  On Jan 11th, [name] (Palliative Care Social Worker) was able to talk with pt and family about goals of care and these are outlined as per her note which stated the following:  Pt confirms that he does not want any further treatment for his skin cancer. Patient states that his goal is to be able to return to as much independence as possible and "get stronger". Discussed that PT has made recommendation that patient be discharged to SNF for rehab and patient is agreeable at this time. Daughter lives in [place] and patient lives in [place] and for these reasons, daughter would like to explore bed offers near or around [place]. Reviewed code status with patient and daughter to which patient states he does not wish to have chest compressions/ressucitation and wants to be a DNR. Care team updated of patient's expressed goals and desire for DNR, including Dr. Wieting.     F , MD  ADDENDUM:  I was made aware that the pt's daughter was wanting to talk to me about rehab vs hospice etc.  We met for about 45 minutes and I counselled her about pts condition and prognosis and his meds.     After much discussion and after many different scenarios and options for future care settings were explored, the daughter, [name], chose to continue with the plan for pt to have rehab for as long as it will be beneficial to him.   SHE WOULD LIKE A PALLIATIVE CARE CONSULT FOR PT WHILE HE IS GETTING REHAB AT THE FACILITY.  (THEN HOSPICE IN THE HOME when his rehab is over).   Pt has a life-limiting invasive cancer and COPD and qualifies for Hospice care now, though rehab may get him back to an optimal functional status and so she would like this for as long as it is helping.   . She lives in [place] with her husband, son, and her 4 yr old granddaughter.  But, she has been taking care of her father for  at least 10 yrs in terms of visiting him once or twice a week and doing all errands, cleaning, meal help, etc.  She has taken him to all doctor appointments.  She will continue the role of caregiver but will want the help of hospice in pts home at the time his rehab is ended.  Pt has another daughter ([name]) who lives in [place] who is not that involved in the direct care of pt (and who 'won't object' to current plan, per [name]).    She and I discussed adding a few palliative type meds to his orders.   See full note to follow.      F , MD 

## 2015-05-11 NOTE — Progress Notes (Signed)
Clinical Education officer, museum (CSW) received call back from patient's daughter [name]. Per daughter she chose [facility]. [facility] admissions coordinator called CSW at 3 pm and said they cannot accept patient because he is confused. CSW called daughter and made her aware of above. Daughter chose [facility]. Per [facility] admissions coordinator at [facility] patient can come to room 501 tomorrow. CSW sent D/C Summary, FL2 and D/C packet to [facility] via [place] today. CSW gave daughter address to [facility]. CSW will continue to follow and assist as needed.   Blima Rich, [place] [phone]

## 2015-05-11 NOTE — Progress Notes (Addendum)
Physical Therapy Treatment Patient Details Name: [name] MRN: [id number] DOB: 11/05/1924 Today's Date: 05/11/2015    History of Present Illness [name] is a 82 y.o. male with a known history of basal cell carcinoma skin, GERD, hypertension, rheumatoid arthritis presented to the emergency room after sustaining a fall. Patient felt dizzy and lost balance and fell down. No history of any head injury. Patient usually lives alone at home but recently his daughter moved in after Christmas Eve. He also had a fall during Christmas Eve. Patient complains of cough for the last couple of days which is productive of yellowish phlegm. He coughs he has some chest discomfort. of an. No history of any sick contacts at home.Had low grade fever. Had an extensive surgery for the basal cell cancer skin on the right side of the face. Patient was worked up with a CT chest which showed pneumonia. Patient is on oral steroids as outpatient. Imaging revealed multiple R posterior rib fractures as well as fracture at the base of corocoid process of R scapula. Pt reports 3 falls over the last 12 months    PT Comments    Pt demonstrates decreased safety awareness and balance during ambulation.Gait speed is very slow. Pt requires extended time to complete ambulation and SaO2 drops considerably requiring increase in supplemental O2 and extended time to recover. Frequency modified from 7x/wk to 2-6x due to patient tolerance for activity. Pt will benefit from skilled PT services to address deficits in strength, balance, and mobility in order to return to full function at home.    Follow Up Recommendations  SNF     Equipment Recommendations  None recommended by PT    Recommendations for Other Services       Precautions / Restrictions Precautions Precautions: Fall Restrictions Weight Bearing Restrictions: No    Mobility  Bed Mobility Overal bed mobility: Needs Assistance Bed Mobility: Supine to Sit      Supine to sit: Mod assist     General bed mobility comments: Pt with decreased strength and needs heavy cues for sequencing  Transfers Overall transfer level: Needs assistance Equipment used: Rolling walker (2 wheeled) Transfers: Sit to/from Stand Sit to Stand: Min assist;+2 safety/equipment;From elevated surface         General transfer comment: Pt with decreased LE strength and sequencing. Poor anterior weight shifting and assist needed to prevent falls  Ambulation/Gait Ambulation/Gait assistance: Min assist;+2 safety/equipment Ambulation Distance (Feet): 60 Feet Assistive device: Rolling walker (2 wheeled) Gait Pattern/deviations: Decreased step length - right;Decreased step length - left Gait velocity: Decreased Gait velocity interpretation: <1.8 ft/sec, indicative of risk for recurrent falls General Gait Details: Pt ambulated very slowly with therapist and +2 for oxygen and safety into hallway and back. Heavy cues and instruction for proper sequencing with walker. Pt requires continual reminder to stay inside walker during ambulation and for safety with turns. Intermittent posterior LOB requiring minA+1 to prevent falls. Vitals monitored and upon returning to recliner SaO2 at 82%. O2 increased to 4L/min and pt requires 2 minutes to recover to >90%.   Stairs            Wheelchair Mobility    Modified Rankin (Stroke Patients Only)       Balance Overall balance assessment: Needs assistance   Sitting balance-Leahy Scale: Fair     Standing balance support: Bilateral upper extremity supported Standing balance-Leahy Scale: Poor  Cognition Arousal/Alertness: Lethargic Behavior During Therapy: Restless Overall Cognitive Status: Difficult to assess       Memory: Decreased short-term memory              Exercises      General Comments        Pertinent Vitals/Pain Pain Assessment: 0-10 Pain Score: 0-No pain Pain  Location: Pt denies pain at rest. Pain increases intermittently with activity Pain Intervention(s): Monitored during session    Home Living                      Prior Function            PT Goals (current goals can now be found in the care plan section) Acute Rehab PT Goals Patient Stated Goal: To decrease pain and return home PT Goal Formulation: With patient Time For Goal Achievement: 05/22/15 Potential to Achieve Goals: Good Progress towards PT goals: Progressing toward goals    Frequency  Min 2X/week    PT Plan Current plan remains appropriate    Co-evaluation             End of Session Equipment Utilized During Treatment: Oxygen Activity Tolerance: Patient limited by fatigue Patient left: with call bell/phone within reach;in chair;with chair alarm set     Time: [id number] PT Time Calculation (min) (ACUTE ONLY): 16 min  Charges:  $Gait Training: 8-22 mins                    G Codes:      [name] PT, DPT   Barton,Jason 05/11/2015, 10:56 AM

## 2015-05-11 NOTE — Discharge Summary (Addendum)
[facility] [place] NAME: [name]    MR#:  [id number]  DATE OF BIRTH:  1925-01-25  DATE OF ADMISSION:  05/07/2015 ADMITTING PHYSICIAN: Saundra Shelling, MD  DATE OF DISCHARGE: 05/12/2015  PRIMARY CARE PHYSICIAN: Otilio Miu, MD    ADMISSION DIAGNOSIS:  Aspiration pneumonia of both lungs, unspecified aspiration pneumonia type, unspecified part of lung ([place]) [J69.0]  DISCHARGE DIAGNOSIS:  Principal Problem:   Community acquired pneumonia Active Problems:   Fall   SECONDARY DIAGNOSIS:   Past Medical History  Diagnosis Date  . GERD (gastroesophageal reflux disease)   . Hypertension   . COPD (chronic obstructive pulmonary disease) ([place])   . Basal cell carcinoma   . Rheumatoid arthritis [facility])     HOSPITAL COURSE:   1. clinical sepsis present on admission. Abscess right neck, pneumonia tachycardia and leukocytosis. Patient was on vancomycin and meropenem and Zithromax. ENT did not feel like there was anything that needed to be drained. As per infectious disease, switch antibiotics over to Keflex and Flagyl for another 21 days. 2. 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 He has undergone radiation therapy. Still has drainage from the area. Overall prognosis is poor. Follow-up with palliative care team at [facility]. 3. Rib fractures with chest pain- as necessary pain medication. 4. History of COPD 5. Rheumatoid arthritis on chronic prednisone 6. BPH on Flomax 7. Dysphagia 3 diet with thin liquids  DISCHARGE CONDITIONS:   Guarded  CONSULTS OBTAINED:  Treatment Team:  Clyde Canterbury, MD Adrian Prows, MD  DRUG ALLERGIES:   Allergies  Allergen Reactions  . Acetaminophen-Codeine Other (See Comments)    Crazy dreams  . Albuterol Other (See Comments)    trembling, lung infection(?)  . Penicillins Swelling and Other (See Comments)    Has patient had a PCN reaction  causing immediate rash, facial/tongue/throat swelling, SOB or lightheadedness with hypotension: No Has patient had a PCN reaction causing severe rash involving mucus membranes or skin necrosis: No Has patient had a PCN reaction that required hospitalization No Has patient had a PCN reaction occurring within the last 10 years: No If all of the above answers are "NO", then may proceed with Cephalosporin use.    DISCHARGE MEDICATIONS:   Current Discharge Medication List    START taking these medications   Details  acetaminophen (TYLENOL) 325 MG tablet Take 2 tablets (650 mg total) by mouth every 6 (six) hours as needed for mild pain.    cephALEXin (KEFLEX) 500 MG capsule 21 days of one tablet tid then stop Qty: 63 capsule, Refills: 0    metroNIDAZOLE (FLAGYL) 500 MG tablet 21 day of one tablet three times a day then stop Qty: 63 tablet, Refills: 0    nicotine (NICODERM CQ - DOSED IN MG/24 HOURS) 21 mg/24hr patch Place 1 patch (21 mg total) onto the skin daily. Qty: 28 patch, Refills: 0    temazepam (RESTORIL) 7.5 MG capsule Take 1 capsule (7.5 mg total) by mouth at bedtime as needed for sleep. Qty: 30 capsule, Refills: 0      CONTINUE these medications which have NOT CHANGED   Details  budesonide-formoterol (SYMBICORT) 160-4.5 MCG/ACT inhaler Inhale 2 puffs into the lungs 2 (two) times daily. Qty: 1 Inhaler, Refills: 6   Associated Diagnoses: Chronic obstructive pulmonary disease, unspecified COPD, unspecified chronic bronchitis type    folic acid (FOLVITE) 1 MG tablet Take 2 tablets by mouth daily.  lisinopril-hydrochlorothiazide (PRINZIDE,ZESTORETIC) 20-25 MG per tablet Take 1 tablet by mouth daily. Qty: 30 tablet, Refills: 5   Associated Diagnoses: Essential hypertension    predniSONE (DELTASONE) 5 MG tablet Take 1 tablet by mouth daily.    ranitidine (ZANTAC) 150 MG tablet Take 1 tablet (150 mg total) by mouth daily. Qty: 30 tablet, Refills: 5   Associated Diagnoses:  GERD without esophagitis    tamsulosin (FLOMAX) 0.4 MG CAPS capsule Take 1 capsule by mouth daily.    tiotropium (SPIRIVA HANDIHALER) 18 MCG inhalation capsule Place 1 capsule (18 mcg total) into inhaler and inhale daily at 6 (six) AM. Qty: 30 capsule, Refills: 5   Associated Diagnoses: Chronic obstructive pulmonary disease, unspecified COPD, unspecified chronic bronchitis type    traMADol (ULTRAM) 50 MG tablet Take 1 tablet by mouth 3 (three) times daily as needed for moderate pain.       STOP taking these medications     methotrexate (RHEUMATREX) 2.5 MG tablet      acetaminophen-codeine (TYLENOL #3) 300-30 MG tablet          DISCHARGE INSTRUCTIONS:   Follow-up with Dr. at rehabilitation and palliative care and rehabilitation  If you experience worsening of your admission symptoms, develop shortness of breath, life threatening emergency, suicidal or homicidal thoughts you must seek medical attention immediately by calling 911 or calling your MD immediately  if symptoms less severe.  You Must read complete instructions/literature along with all the possible adverse reactions/side effects for all the Medicines you take and that have been prescribed to you. Take any new Medicines after you have completely understood and accept all the possible adverse reactions/side effects.   Please note  You were cared for by a hospitalist during your hospital stay. If you have any questions about your discharge medications or the care you received while you were in the hospital after you are discharged, you can call the unit and asked to speak with the hospitalist on call if the hospitalist that took care of you is not available. Once you are discharged, your primary care physician will handle any further medical issues. Please note that NO REFILLS for any discharge medications will be authorized once you are discharged, as it is imperative that you return to your primary care physician (or establish  a relationship with a primary care physician if you do not have one) for your aftercare needs so that they can reassess your need for medications and monitor your lab values.    Today   CHIEF COMPLAINT:   Chief Complaint  Patient presents with  . Fall    HISTORY OF PRESENT ILLNESS:  [name]  is a 82 y.o. male with a known history of squamous cell cancer of the face presents with a fall and rib fractures. In fact have pneumonia.   VITAL SIGNS:  Blood pressure 101/54, pulse 109, temperature 98 F (36.7 C), temperature source Oral, resp. rate 22, height 6' (1.829 m), weight 60.328 kg (133 lb), SpO2 100 %.   PHYSICAL EXAMINATION:  GENERAL:  81 y.o.-year-old patient lying in the bed with no acute distress.  EYES: Pupils equal, round, reactive to light and accommodation. No scleral icterus. Extraocular muscles intact.  HEENT: Head atraumatic, normocephalic. Oropharynx and nasopharynx clear.  NECK:  Supple, no jugular venous distention. No thyroid enlargement, no tenderness.  LUNGS: Decreased breath sounds bilaterally, no wheezing, rales,rhonchi or crepitation. No use of accessory muscles of respiration.  CARDIOVASCULAR: S1, S2 normal. No murmurs, rubs, or gallops.  ABDOMEN: Soft, non-tender, non-distended. Bowel sounds present. No organomegaly or mass.  EXTREMITIES: No pedal edema, cyanosis, or clubbing.  NEUROLOGIC: Cranial nerves II through XII are intact. Gait not checked.  PSYCHIATRIC: The patient is alert and oriented x 3.  SKIN: No obvious rash, lesion, or ulcer.   DATA REVIEW:   CBC  Recent Labs Lab 05/11/15 0800  WBC 8.7  HGB 11.9*  HCT 35.4*  PLT 175    Chemistries   Recent Labs Lab 05/07/15 2126  05/11/15 0800  NA 134*  < > 125*  K 3.9  < > 4.1  CL 101  < > 88*  CO2 25  < > 28  GLUCOSE 127*  < > 102*  BUN 46*  < > 21*  CREATININE 1.63*  < > 0.86  CALCIUM 8.5*  < > 8.1*  AST 23  --   --   ALT 22  --   --   ALKPHOS 79  --   --   BILITOT 0.5  --    --   < > = values in this interval not displayed.  Cardiac Enzymes  Recent Labs Lab 05/08/15 1041  TROPONINI 0.04*    Microbiology Results  Results for orders placed or performed during the hospital encounter of 05/07/15  Culture, blood (Routine X 2) w Reflex to ID Panel     Status: None (Preliminary result)   Collection Time: 05/07/15 11:12 PM  Result Value Ref Range Status   Specimen Description BLOOD LEFT ANTECUBITAL  Final   Special Requests BOTTLES DRAWN AEROBIC AND ANAEROBIC 10ML  Final   Culture NO GROWTH 4 DAYS  Final   Report Status PENDING  Incomplete  Culture, blood (Routine X 2) w Reflex to ID Panel     Status: None (Preliminary result)   Collection Time: 05/07/15 11:15 PM  Result Value Ref Range Status   Specimen Description BLOOD RIGHT WRIST  Final   Special Requests BOTTLES DRAWN AEROBIC AND ANAEROBIC 5ML  Final   Culture NO GROWTH 4 DAYS  Final   Report Status PENDING  Incomplete  Wound culture     Status: None (Preliminary result)   Collection Time: 05/09/15  2:30 PM  Result Value Ref Range Status   Specimen Description NECK right neck  Final   Special Requests Immunocompromised  Final   Gram Stain RARE WBC SEEN NO ORGANISMS SEEN   Final   Culture HOLDING FOR POSSIBLE PATHOGEN  Final   Report Status PENDING  Incomplete    Management plans discussed with the patient, family and they are in agreement.  CODE STATUS:     Code Status Orders        Start     Ordered   05/09/15 1245  Do not attempt resuscitation (DNR)   Continuous    Question Answer Comment  In the event of cardiac or respiratory ARREST Do not call a "code blue"   In the event of cardiac or respiratory ARREST Do not perform Intubation, CPR, defibrillation or ACLS   In the event of cardiac or respiratory ARREST Use medication by any route, position, wound care, and other measures to relive pain and suffering. May use oxygen, suction and manual treatment of airway obstruction as needed  for comfort.   Comments nurse may pronounce      05/09/15 1245    Code Status History    Date Active Date Inactive Code Status Order ID Comments User Context   05/08/2015  3:07 AM 05/09/2015  12:45 PM Full Code [id number]  Saundra Shelling, MD Inpatient      TOTAL TIME TAKING CARE OF THIS PATIENT:31 minutes.    Loletha Grayer M.D on 05/11/2015 at 2:19 PM, addendum 05/12/2015  Between 7am to 6pm - Pager - [phone]  After 6pm go to [email or web address] - password [email or web address] [facility]  [place] [facility]  Office  [phone]  CC: Primary care physician; Otilio Miu, MD
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
## 2015-05-11 NOTE — NC FL2 (Signed)
[place] LEVEL OF CARE SCREENING TOOL     IDENTIFICATION  Patient Name: [name] Birthdate: [birth year] Sex: male Admission Date (Current Location): 05/07/2015  [facility] and [place] Number:  Engineering geologist and Address:  [facility], [address] [place],  [address]      Provider Number: [id number]  Attending Physician Name and Address:  Loletha Grayer, MD  Relative Name and Phone Number:       Current Level of Care: Hospital Recommended Level of Care: [place] Prior Approval Number:    Date Approved/Denied:   PASRR Number:    Discharge Plan: SNF    Current Diagnoses: Patient Active Problem List   Diagnosis Date Noted  . Community acquired pneumonia 05/08/2015  . Fall 05/08/2015  . Essential hypertension 01/09/2015  . Esophageal reflux 01/09/2015  . Centrilobular emphysema ([place]) 01/09/2015    Orientation RESPIRATION BLADDER Height & Weight    Self, Time, Situation, Place  O2 Continent 6' (182.9 cm) 133 lbs.  BEHAVIORAL SYMPTOMS/MOOD NEUROLOGICAL BOWEL NUTRITION STATUS      Continent Diet (Dys 3, thin liquids)  AMBULATORY STATUS COMMUNICATION OF NEEDS Skin   Extensive Assist   Normal                       Personal Care Assistance Level of Assistance  Bathing, Feeding, Dressing Bathing Assistance: Maximum assistance Feeding assistance: Maximum assistance Dressing Assistance: Maximum assistance     Functional Limitations Info  Sight, Hearing, Speech Sight Info: Impaired Hearing Info: Impaired Speech Info: Impaired    SPECIAL CARE FACTORS FREQUENCY  PT (By licensed PT)     PT Frequency: 5              Contractures      Additional Factors Info  Allergies, Code Status Code Status Info: DNR Allergies Info: Allergies: Acetaminophen-codeine, Albuterol, Penicillins           Current Medications (05/11/2015):  This is the current hospital active medication  list Current Facility-Administered Medications  Medication Dose Route Frequency Provider Last Rate Last Dose  . acetaminophen (TYLENOL) tablet 650 mg  650 mg Oral Q6H PRN Epifanio Lesches, MD      . budesonide-formoterol (SYMBICORT) 160-4.5 MCG/ACT inhaler 2 puff  2 puff Inhalation BID Saundra Shelling, MD   2 puff at 05/11/15 1000  . cefTRIAXone (ROCEPHIN) 1 g in dextrose 5 % 50 mL IVPB  1 g Intravenous Q24H Adrian Prows, MD   1 g at 05/10/15 2158  . enoxaparin (LOVENOX) injection 40 mg  40 mg Subcutaneous Q24H Pavan Pyreddy, MD   40 mg at 05/10/15 2230  . famotidine (PEPCID) tablet 20 mg  20 mg Oral Daily Pavan Pyreddy, MD   20 mg at 05/11/15 1100  . folic acid (FOLVITE) tablet 2 mg  2 mg Oral Daily Pavan Pyreddy, MD   2 mg at 05/11/15 1100  . methotrexate (RHEUMATREX) tablet 15 mg  15 mg Oral Weekly Saundra Shelling, MD   15 mg at 05/11/15 1000  . morphine 2 MG/ML injection 2 mg  2 mg Intravenous Q4H PRN Saundra Shelling, MD   2 mg at 05/11/15 0515  . morphine CONCENTRATE 10 MG/0.5ML oral solution 10 mg  10 mg Oral Q2H PRN Loletha Grayer, MD      . nicotine (NICODERM CQ - dosed in mg/24 hours) patch 21 mg  21 mg Transdermal Daily Loletha Grayer, MD   21 mg at 05/11/15 1100  .  nitroGLYCERIN (NITROSTAT) SL tablet 0.4 mg  0.4 mg Sublingual Q5 min PRN Saundra Shelling, MD      . ondansetron (ZOFRAN) injection 4 mg  4 mg Intravenous Q4H PRN Lytle Butte, MD   4 mg at 05/11/15 0156  . predniSONE (DELTASONE) tablet 5 mg  5 mg Oral Daily Pavan Pyreddy, MD   5 mg at 05/11/15 1100  . tamsulosin (FLOMAX) capsule 0.4 mg  0.4 mg Oral Daily Pavan Pyreddy, MD   0.4 mg at 05/11/15 1100  . temazepam (RESTORIL) capsule 7.5 mg  7.5 mg Oral QHS Loletha Grayer, MD   7.5 mg at 05/10/15 2230  . tiotropium (SPIRIVA) inhalation capsule 18 mcg  18 mcg Inhalation Q0600 Saundra Shelling, MD   18 mcg at 05/11/15 1000  . traMADol (ULTRAM) tablet 100 mg  100 mg Oral Q6H PRN Loletha Grayer, MD   100 mg at 05/11/15 0110      Discharge Medications: Please see discharge summary for a list of discharge medications.  Relevant Imaging Results:  Relevant Lab Results:   Additional Information SSN: [id number]  Loralyn Freshwater, LCSW

## 2015-05-11 NOTE — Progress Notes (Signed)
Patient ID: [name], male   DOB: 1924-08-30, 82 y.o.   MRN: [id number] [facility] PROGRESS NOTE  PCP: Otilio Miu, MD  HPI/Subjective: Patient not feeling well at all. Patient is hard of hearing. Has not had a bowel movement in a few days. Still having chest pain but not as bad today.  Objective: Filed Vitals:   05/11/15 0514 05/11/15 1334  BP: 111/61 101/54  Pulse: 104 109  Temp: 97.5 F (36.4 C) 98 F (36.7 C)  Resp: 22     Filed Weights   05/07/15 2005 05/08/15 0312  Weight: 60.691 kg (133 lb 12.8 oz) 60.328 kg (133 lb)    ROS: Review of Systems  Constitutional: Negative for fever and chills.  Eyes: Negative for blurred vision.  Respiratory: Positive for cough and shortness of breath.   Cardiovascular: Positive for chest pain.  Gastrointestinal: Negative for nausea, vomiting, abdominal pain, diarrhea and constipation.  Genitourinary: Negative for dysuria.  Musculoskeletal: Negative for joint pain.  Neurological: Negative for dizziness and headaches.   Exam: Physical Exam  Constitutional: He is oriented to person, place, and time.  HENT:  Nose: No mucosal edema.  Mouth/Throat: No oropharyngeal exudate or posterior oropharyngeal edema.  Eyes: Conjunctivae, EOM and lids are normal. Pupils are equal, round, and reactive to light.  Neck: No JVD present. Carotid bruit is not present. No edema present. No thyroid mass and no thyromegaly present.  Cardiovascular: S1 normal and S2 normal.  Exam reveals no gallop.   No murmur heard. Pulses:      Dorsalis pedis pulses are 2+ on the right side, and 2+ on the left side.  Respiratory: No respiratory distress. He has decreased breath sounds in the right lower field and the left lower field. He has no wheezes. He has no rhonchi. He has no rales.  GI: Soft. Bowel sounds are normal. There is no tenderness.  Musculoskeletal:       Right ankle: He exhibits no swelling.       Left ankle: He exhibits no  swelling.  Lymphadenopathy:    He has no cervical adenopathy.  Neurological: He is alert and oriented to person, place, and time. No cranial nerve deficit.  Skin: Skin is warm. Nails show no clubbing.  Right neck with packing.  Psychiatric: He has a normal mood and affect.    Data Reviewed: Basic Metabolic Panel:  Recent Labs Lab 05/07/15 2126 05/08/15 0446 05/09/15 0634 05/11/15 0800  NA 134* 135 132* 125*  K 3.9 4.3 4.3 4.1  CL 101 104 102 88*  CO2 25 26 26 28   GLUCOSE 127* 121* 103* 102*  BUN 46* 41* 27* 21*  CREATININE 1.63* 1.36* 0.98 0.86  CALCIUM 8.5* 8.1* 8.0* 8.1*   Liver Function Tests:  Recent Labs Lab 05/07/15 2126  AST 23  ALT 22  ALKPHOS 79  BILITOT 0.5  PROT 6.4*  ALBUMIN 3.5   CBC:  Recent Labs Lab 05/07/15 2126 05/08/15 0446 05/09/15 0634 05/11/15 0800  WBC 24.3* 14.3* 12.2* 8.7  NEUTROABS 23.4*  --   --   --   HGB 12.7* 12.0* 11.7* 11.9*  HCT 39.5* 35.8* 36.0* 35.4*  MCV 89.5 89.2 88.7 88.4  PLT 259 205 185 175   Cardiac Enzymes:  Recent Labs Lab 05/07/15 2126 05/08/15 0446 05/08/15 1041  TROPONINI 0.03 0.03 0.04*     Recent Results (from the past 240 hour(s))  Culture, blood (Routine X 2) w Reflex to ID Panel  Status: None (Preliminary result)   Collection Time: 05/07/15 11:12 PM  Result Value Ref Range Status   Specimen Description BLOOD LEFT ANTECUBITAL  Final   Special Requests BOTTLES DRAWN AEROBIC AND ANAEROBIC 10ML  Final   Culture NO GROWTH 4 DAYS  Final   Report Status PENDING  Incomplete  Culture, blood (Routine X 2) w Reflex to ID Panel     Status: None (Preliminary result)   Collection Time: 05/07/15 11:15 PM  Result Value Ref Range Status   Specimen Description BLOOD RIGHT WRIST  Final   Special Requests BOTTLES DRAWN AEROBIC AND ANAEROBIC 5ML  Final   Culture NO GROWTH 4 DAYS  Final   Report Status PENDING  Incomplete  Wound culture     Status: None (Preliminary result)   Collection Time: 05/09/15   2:30 PM  Result Value Ref Range Status   Specimen Description NECK right neck  Final   Special Requests Immunocompromised  Final   Gram Stain RARE WBC SEEN NO ORGANISMS SEEN   Final   Culture   Final    LIGHT GROWTH GRAM NEGATIVE RODS IDENTIFICATION AND SUSCEPTIBILITIES TO FOLLOW    Report Status PENDING  Incomplete      Scheduled Meds: . budesonide-formoterol  2 puff Inhalation BID  . cefTRIAXone (ROCEPHIN)  IV  1 g Intravenous Q24H  . enoxaparin (LOVENOX) injection  40 mg Subcutaneous Q24H  . famotidine  20 mg Oral Daily  . folic acid  2 mg Oral Daily  . methotrexate  15 mg Oral Weekly  . nicotine  21 mg Transdermal Daily  . polyethylene glycol  17 g Oral Daily  . predniSONE  5 mg Oral Daily  . tamsulosin  0.4 mg Oral Daily  . temazepam  7.5 mg Oral QHS  . tiotropium  18 mcg Inhalation Q0600    Assessment/Plan:  1. Clinical sepsis present on admission, possible abscess right neck, pneumonia, tachycardia and leukocytosis. In speaking with infectious disease specialist switch over to Keflex and Flagyl for 21 days upon discharge to rehabilitation tomorrow.  DO NOT RESUSCITATE. 2. 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 Has undergone radiation therapy. Patient with continued drainage from the area. Overall prognosis poor.  DO NOT RESUSCITATE. 3. Rib fractures and chest pain- when necessary pain medication 4. History of COPD 5. History of rheumatoid arthritis on prednisone chronically 6. BPH on Flomax  Code Status: Patient just made a DO NOT RESUSCITATE    Code Status Orders        Start     Ordered   05/08/15 0307  Full code   Continuous     05/08/15 0307    Code Status History    Date Active Date Inactive Code Status Order ID Comments User Context   This patient has a current code status but no historical code status.     Family Communication: Spoke with daughter at the bedside Disposition Plan: Rehabilitation  tomorrow with palliative care  Consultants:  ENT  Infectious disease  Palliative care  Antibiotics:  Keflex and Flagyl upon discharge.  Time spent: 20 minutes  Loletha Grayer  [facility]

## 2015-05-11 NOTE — Discharge Instructions (Signed)

## 2015-05-11 NOTE — Progress Notes (Signed)
Pharmacy Antibiotic Follow-up Note  [name] is a 82 y.o. year-old male admitted on 05/07/2015.  The patient is currently on day 3  of Vancomycin 750mg  IV q18h and day 4 of Azithromycin/Ceftriaxone for sepsis secondary to abscess of neck and CAP.  ID consulted.  Assessment/Plan: This patient's current antibiotics will be continued without adjustments.  Scr improved from 1.36 to 0.98. Pharmacokinetic parameters suggest no change in dose.  Ke 0.041  T1/2 16.9  Vd 42.  Continue current therapy for now.  F/U Wound cx from neck  Vancomycin  trough ordered for 1/14 at 0600  Temp (24hrs), Avg:97.5 F (36.4 C), Min:97.2 F (36.2 C), Max:97.7 F (36.5 C)   Recent Labs Lab 05/07/15 2126 05/08/15 0446 05/09/15 0634  WBC 24.3* 14.3* 12.2*     Recent Labs Lab 05/07/15 2126 05/08/15 0446 05/09/15 0634  CREATININE 1.63* 1.36* 0.98   Estimated Creatinine Clearance: 44.4 mL/min (by C-G formula based on Cr of 0.98).    Allergies  Allergen Reactions  . Acetaminophen-Codeine Other (See Comments)    Crazy dreams  . Albuterol Other (See Comments)    trembling, lung infection(?)  . Penicillins Swelling and Other (See Comments)    Has patient had a PCN reaction causing immediate rash, facial/tongue/throat swelling, SOB or lightheadedness with hypotension: No Has patient had a PCN reaction causing severe rash involving mucus membranes or skin necrosis: No Has patient had a PCN reaction that required hospitalization No Has patient had a PCN reaction occurring within the last 10 years: No If all of the above answers are "NO", then may proceed with Cephalosporin use.    Antimicrobials this admission: Merrem 1 g IV q12 hours  1/10 >> 1/12  Azithromycin  500 mg 1/10 >>  CTX 1 gram Q24h  1 dose on 1/10 then restarted by ID on 1/12 >> Vancomycin 750 mg IV q18 hours 1/11 Levels/dose changes this admission: 1/14 0600=  Microbiology results:  1/9 BCx: NGTD 1/11 Wound cx: NGTD     MRSA PCR:   Thank you for allowing pharmacy to be a part of this patient's care.  [name] A PharmD 05/11/2015 9:16 AM

## 2015-05-11 NOTE — Progress Notes (Signed)
Clinical Education officer, museum (CSW) attempted to meet with patient to present bed offers however he was confused. CSW attempted to call patient's daughter [name] and left a voicemail on her cell and home phone. CSW contacted MD to inquire when patient may be ready for D/C. Per MD wound cultures are pending. CSW made MD aware that a D/C Summary will need be completed today if anticipate weekend D/C. CSW will continue to follow and assist as needed.   Blima Rich, [place] [phone]

## 2015-05-11 NOTE — Progress Notes (Addendum)
Nutrition Follow-up  DOCUMENTATION CODES:   Severe malnutrition in context of acute illness/injury  INTERVENTION:   Meals and Snacks: Cater to patient preferences, continue with Dysphagia III. Provide encouragement at all meal times. Medical Food Supplement Therapy: pt reports not liking Ensure, will discontinue and send Magic Cup BID as pt reports liking ice cream and Mighty Shakes on meal trays TID for added nutrition (each supplement provides approximately 300kcals and 9g protein)  NUTRITION DIAGNOSIS:   Malnutrition related to chronic illness, acute illness as evidenced by moderate depletion of body fat, severe depletion of muscle mass, percent weight loss.  GOAL:   Patient will meet greater than or equal to 90% of their needs  MONITOR:    (Energy Intake, Anthropometrics, Electrolyte and renal Profile, Digestive system)  REASON FOR ASSESSMENT:   Malnutrition Screening Tool    ASSESSMENT:   Pt admitted with falls and found to also have community acquired pna. Pt with boil on right side of face with h/o basal cell carcinoma, oncology following.   Per MD note, ENT did not feel there was anything to be drained from abscess of right neck. I&D following for antibiotic therapy.  Diet Order:  DIET DYS 3 Room service appropriate?: Yes; Fluid consistency:: Thin    Current Nutrition: Pt ate 25% of breakfast this am including french toast and orange juice. Pt took sips of Ensure reports not really liking. Pt reports eating dinner last night. Recorded po intake <50% thus far.    Gastrointestinal Profile: Last BM: 05/08/2015   Scheduled Medications:  . azithromycin  500 mg Intravenous Q24H  . budesonide-formoterol  2 puff Inhalation BID  . cefTRIAXone (ROCEPHIN)  IV  1 g Intravenous Q24H  . enoxaparin (LOVENOX) injection  40 mg Subcutaneous Q24H  . famotidine  20 mg Oral Daily  . folic acid  2 mg Oral Daily  . methotrexate  15 mg Oral Weekly  . nicotine  21 mg Transdermal  Daily  . predniSONE  5 mg Oral Daily  . tamsulosin  0.4 mg Oral Daily  . temazepam  7.5 mg Oral QHS  . tiotropium  18 mcg Inhalation Q0600  . vancomycin  750 mg Intravenous Q18H    Electrolyte/Renal Profile and Glucose Profile:   Recent Labs Lab 05/08/15 0446 05/09/15 0634 05/11/15 0800  NA 135 132* 125*  K 4.3 4.3 4.1  CL 104 102 88*  CO2 26 26 28   BUN 41* 27* 21*  CREATININE 1.36* 0.98 0.86  CALCIUM 8.1* 8.0* 8.1*  GLUCOSE 121* 103* 102*   Protein Profile:  Recent Labs Lab 05/07/15 2126  ALBUMIN 3.5     Weight Trend since Admission: [place] Weights   05/07/15 2005 05/08/15 0312  Weight: 133 lb 12.8 oz (60.691 kg) 133 lb (60.328 kg)   Ideal Body Weight:     BMI:  Body mass index is 18.03 kg/(m^2).  Estimated Nutritional Needs:   Kcal:  BEE: 1512kcals, TEE: (IF 1.1-1.3)(AF 1.2) 1996-2358kcals  Protein:  81-97g protein (1.0-1.2g/kg)  Fluid:  2022-2452mL of fluid (25-41mL/kg)  EDUCATION NEEDS:   No education needs identified at this time   [place], RD, LDN Pager [phone] Weekend/On-Call Pager [phone]

## 2015-05-11 NOTE — Consult Note (Signed)
Palliative Medicine Inpatient Consult Note   Name: [name] Date: 05/11/2015 MRN: [id number]  DOB: 09-18-1924  Referring Physician: Loletha Grayer, MD  Palliative Care consult requested for this 82 y.o. 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   -----------------------------------------------------------------------------------------------  TODAY'S DISCUSSIONS AND DECISIONS: 1.  Pt is DNR.  I completed portable form  2.  Pt will need PALLIATIVE CARE CONSULT in the SNF when he goes.  3.  His daughter will move into his home when he is discharged from [facility].    More info: After much discussion and after many different scenarios and options for future care settings were explored, the daughter, [name], chose to continue with the plan for pt to have rehab for as long as it will be beneficial to him.   SHE WOULD LIKE A PALLIATIVE CARE CONSULT FOR PT WHILE HE IS GETTING REHAB AT THE FACILITY. ([facility] when his rehab is over). Pt has a life-limiting invasive cancer and COPD and qualifies for Hospice care now, though rehab may get him back to an optimal functional status and so she would like this for as long as it is helping.   . She lives in [place] with her husband, son, and her 63 yr old granddaughter. But, she has been taking care of her father for at least 10 yrs in terms of visiting him once or twice a week and doing all errands, cleaning, meal help, etc. She has taken him to all doctor appointments. She will continue the role of caregiver but will want the help of hospice in pts home at the time his rehab is ended. Pt has another daughter [name]) who lives in [place] who is not that involved in the direct care of pt (and who 'won't object' to  current plan, per [name]).   She and I discussed adding a few palliative type meds to his orders.   I have made Dr Earleen Newport aware of this information.  I will pass along info so that a [place] (pt was to go to [facility] but they turned him down due to him being confused at times.  Pt is not known to have a diagnosed dementia, however --per daughter.     --------------------------------------------------------------------------------------------------   IMPRESSION: Squamous Cell Cancer of the right ear with invasion of tempral (skull) bone and parotid gland on right. ---no longer being treated by [facility] ---had been treated by radiation oncology with last visit summer of 2015.   ---in 2015, pt refused surgical resection due to the extent of surgery required ---he opted for radiation instead, but with such an extensive tumor, radiation was not expected to ever cure this cancer.   ---Pt has been followed by primary since for this problem (Dr. Cristi Loron) ---In December, the cancer opened up to be a large ulcerated mass that was being dressed daily by his daughter, [name], who temporarily moved into his home to help with daily dressings given how bad the cancer had gotten.   It was thought that there was an abscess associated with this cancerous mass, but there was none seen on CT scan.  Fall with increasing dizziness ---with broken ribs Emphysema Mild to Moderate Malnutrition due to poor oral intake Suspected aspiration vs  community acquired pneumonia GERD Constipation Chest Pain Metabolic encephalopathy Sepsis at admission due to pneumonia HTN H/O Basal Cell CA with Mohs surgery Former Smoker Tachycardia BPH  Rheumatoid Arthritis on MTX and prednisone Acute Renal Failure --resolved --due to dehydration Leukocytosis --resolved Anemia of unclear etiology but likely to be due to chronic disease Possible chronic lung infection (see  imaging report) Hyponatremia with sodium 125 today     REVIEW OF SYSTEMS:  Patient is not able to provide ROS in detail due to illness and some mild confusion  SPIRITUAL SUPPORT SYSTEM: Yes  -primarily daughter, [name].  SOCIAL HISTORY:  reports that he has quit smoking. His smokeless tobacco use includes Chew. He reports that he does not drink alcohol or use illicit drugs.  LEGAL DOCUMENTS:  I placed a portable DNR form in the paper chart.   CODE STATUS: DNR  PAST MEDICAL HISTORY: Past Medical History  Diagnosis Date  . GERD (gastroesophageal reflux disease)   . Hypertension   . COPD (chronic obstructive pulmonary disease) ([place])   . Basal cell carcinoma   . Rheumatoid arthritis ([place])     PAST SURGICAL HISTORY:  Past Surgical History  Procedure Laterality Date  . Mohs surgery      ALLERGIES:  is allergic to acetaminophen-codeine; albuterol; and penicillins.  MEDICATIONS:  Current Facility-Administered Medications  Medication Dose Route Frequency Provider Last Rate Last Dose  . acetaminophen (TYLENOL) tablet 650 mg  650 mg Oral Q6H PRN Epifanio Lesches, MD      . budesonide-formoterol (SYMBICORT) 160-4.5 MCG/ACT inhaler 2 puff  2 puff Inhalation BID Saundra Shelling, MD   2 puff at 05/11/15 1000  . cefTRIAXone (ROCEPHIN) 1 g in dextrose 5 % 50 mL IVPB  1 g Intravenous Q24H Adrian Prows, MD   1 g at 05/10/15 2158  . enoxaparin (LOVENOX) injection 40 mg  40 mg Subcutaneous Q24H Pavan Pyreddy, MD   40 mg at 05/10/15 2230  . famotidine (PEPCID) tablet 20 mg  20 mg Oral Daily Pavan Pyreddy, MD   20 mg at 05/11/15 1100  . folic acid (FOLVITE) tablet 2 mg  2 mg Oral Daily Pavan Pyreddy, MD   2 mg at 05/11/15 1100  . methotrexate (RHEUMATREX) tablet 15 mg  15 mg Oral Weekly Saundra Shelling, MD   15 mg at 05/11/15 1000  . morphine 2 MG/ML injection 2 mg  2 mg Intravenous Q4H PRN Saundra Shelling, MD   2 mg at 05/11/15 0515  . morphine CONCENTRATE 10 MG/0.5ML oral solution 10 mg   10 mg Oral Q2H PRN Loletha Grayer, MD      . nicotine (NICODERM CQ - dosed in mg/24 hours) patch 21 mg  21 mg Transdermal Daily Loletha Grayer, MD   21 mg at 05/11/15 1100  . nitroGLYCERIN (NITROSTAT) SL tablet 0.4 mg  0.4 mg Sublingual Q5 min PRN Saundra Shelling, MD      . ondansetron (ZOFRAN) injection 4 mg  4 mg Intravenous Q4H PRN Lytle Butte, MD   4 mg at 05/11/15 0156  . polyethylene glycol (MIRALAX / GLYCOLAX) packet 17 g  17 g Oral Daily Loletha Grayer, MD      . predniSONE (DELTASONE) tablet 5 mg  5 mg Oral Daily Pavan Pyreddy, MD   5 mg at 05/11/15 1100  . tamsulosin (FLOMAX) capsule 0.4 mg  0.4 mg Oral Daily Pavan Pyreddy, MD   0.4 mg at 05/11/15 1100  . temazepam (RESTORIL) capsule 7.5 mg  7.5 mg Oral QHS Richard  Wieting, MD   7.5 mg at 05/10/15 2230  . tiotropium (SPIRIVA) inhalation capsule 18 mcg  18 mcg Inhalation Q0600 Saundra Shelling, MD   18 mcg at 05/11/15 1000  . traMADol (ULTRAM) tablet 100 mg  100 mg Oral Q6H PRN Loletha Grayer, MD   100 mg at 05/11/15 0110    Vital Signs: BP 101/54 mmHg  Pulse 109  Temp(Src) 98 F (36.7 C) (Oral)  Resp 22  Ht 6' (1.829 m)  Wt 60.328 kg (133 lb)  BMI 18.03 kg/m2  SpO2 100% Filed Weights   05/07/15 2005 05/08/15 0312  Weight: 60.691 kg (133 lb 12.8 oz) 60.328 kg (133 lb)    Estimated body mass index is 18.03 kg/(m^2) as calculated from the following:   Height as of this encounter: 6' (1.829 m).   Weight as of this encounter: 60.328 kg (133 lb).  PERFORMANCE STATUS (ECOG) : 3 - Symptomatic, >50% confined to bed  PHYSICAL EXAM: Pt is up in bedside chair.  He answers that he feels no worse and no better. He appears quite frail with a barrel shaped chest with ribs showing. He is alert and can talk some--limited due to weakness, however. EOMI OP clear No JVD or TM Hrt rrr tachy at 110 Lungs with decreased BS bases bilat Abd soft and NT Ext no mottling or cyanosis  LABS: CBC:    Component Value Date/Time   WBC 8.7  05/11/2015 0800   WBC 8.7 12/07/2012 1519   HGB 11.9* 05/11/2015 0800   HGB 11.9* 02/09/2014 1550   HCT 35.4* 05/11/2015 0800   HCT 40.7 12/07/2012 1519   PLT 175 05/11/2015 0800   PLT 258 12/07/2012 1519   MCV 88.4 05/11/2015 0800   MCV 88 12/07/2012 1519   NEUTROABS 23.4* 05/07/2015 2126   LYMPHSABS 0.3* 05/07/2015 2126   MONOABS 0.6 05/07/2015 2126   EOSABS 0.1 05/07/2015 2126   BASOSABS 0.0 05/07/2015 2126   Comprehensive Metabolic Panel:    Component Value Date/Time   NA 125* 05/11/2015 0800   NA 142 01/09/2015 1115   NA 137 12/07/2012 1519   K 4.1 05/11/2015 0800   K 4.2 02/09/2014 1550   CL 88* 05/11/2015 0800   CL 104 12/07/2012 1519   CO2 28 05/11/2015 0800   CO2 27 12/07/2012 1519   BUN 21* 05/11/2015 0800   BUN 17 01/09/2015 1115   BUN 24* 12/07/2012 1519   CREATININE 0.86 05/11/2015 0800   CREATININE 1.75* 12/07/2012 1519   GLUCOSE 102* 05/11/2015 0800   GLUCOSE 58* 01/09/2015 1115   GLUCOSE 78 12/07/2012 1519   CALCIUM 8.1* 05/11/2015 0800   CALCIUM 8.8 12/07/2012 1519   AST 23 05/07/2015 2126   AST 21 12/06/2011 1342   ALT 22 05/07/2015 2126   ALT 15 12/06/2011 1342   ALKPHOS 79 05/07/2015 2126   ALKPHOS 55 12/06/2011 1342   BILITOT 0.5 05/07/2015 2126   BILITOT 0.4 12/06/2011 1342   PROT 6.4* 05/07/2015 2126   PROT 6.3* 12/06/2011 1342   ALBUMIN 3.5 05/07/2015 2126   ALBUMIN 3.6 01/09/2015 1115   ALBUMIN 2.8* 12/06/2011 1342   TESTS:  CT cervical spine 05/07/15: 1. No evidence of significant acute traumatic injury to the skull, brain or cervical spine. 2. Mild cerebral atrophy with chronic microvascular ischemic changes in the cerebral white matter, similar to the prior examination. 3. Multilevel degenerative disc disease and cervical spondylosis, as above. 4. Evidence of probable chronic atypical infection in the lungs, most evident in  the left upper lobe such as mycobacterium avium intracellulare (MAI).  CT Maxillofacial w/ CM  05/09/15: There is extensive subcutaneous soft tissue abnormality on the right beginning just posterior to the external auditory canal and extending inferiorly to the level of the epiglottis. Cranially, the process extends to the posterior cortex of the mandibular condyles. There is extensive overlying soft tissue ulceration. The lesion is mildly hypo attenuating with mild rim and internal septation enhancement. At its greatest dimension, at the angle of the mandible, transverse diameter is 37 x 33 mm. Craniocaudal extent is 7.8 cm. Average attenuation value is 46.  CT chest w/ CM 05/07/15: Bilateral pulmonary nodularity with tree-in-bud appearance concerning for pneumonia. Clinical correlation and follow-up recommended.Multiple right posterior rib fractures with a small right pleural effusion or hemothorax. No pneumothorax.  Nondisplaced fracture of the base of the coronoid process of the right scapula.Partially visualized fluid density structure in the right upper abdomen which may represent a ruptured renal cyst. CT of the abdomen pelvis or ultrasound is recommended for further evaluation.   More than 50% of the visit was spent in counseling/coordination of care: Yes  Time Spent: 80 minutes

## 2015-05-11 NOTE — Plan of Care (Signed)
Problem: Pain Managment: Goal: General experience of comfort will improve Outcome: Progressing Pt complains of pain intermittently.  Morphine and tramadol given.  zofran needed once for nausea.  Problem: Education: Goal: Knowledge of disease or condition will improve Outcome: Progressing Pt confused as to why he is in the hospital.  Once reminded, he is fine.

## 2015-05-12 LAB — CULTURE, BLOOD (ROUTINE X 2)
CULTURE: NO GROWTH
CULTURE: NO GROWTH

## 2015-05-12 MED ORDER — CEPHALEXIN 500 MG PO CAPS: 500.0000 mg | ORAL_CAPSULE | Freq: Three times a day (TID) | ORAL | Status: DC

## 2015-05-12 MED ORDER — METRONIDAZOLE 500 MG PO TABS: 500.0000 mg | ORAL_TABLET | Freq: Three times a day (TID) | ORAL | Status: DC

## 2015-05-12 NOTE — Plan of Care (Signed)
Problem: Pain Managment: Goal: General experience of comfort will improve Outcome: Progressing Pt complains of pain intermittently. PAINAD scale used and pain 6 out of 10.  Morphine given x2 per eMAR.   Problem: Education: Goal: Knowledge of disease or condition will improve Outcome: Progressing Patient with confusion.  Patient reoriented throughout shift.  Patient accepting of care if educated about care before proceeding.

## 2015-05-12 NOTE — Progress Notes (Signed)
MD making rounds. Discharge orders received. Clinical Education officer, museum (CSW) facilitating discharge to [facility]. Discharge packet being prepared. IV removed. Vital Signs Stable. No signs and/or symptoms of distress noted. Denies pain. Denies needs. Attempted to call report to Elmyra Ricks, nurse accepting patient. Nurse unable to take report. RN asked to call back with report. Elmyra Ricks, nurse at lunch. Report given to Juanell Fairly, Therapist, sports at [facility]. No unanswered questions. EMS called for transport. Awaiting EMS. EMS on Unit for transport. Discharge Packet given to EMS transporter. Belongings sent with family member.

## 2015-05-12 NOTE — Clinical Social Work Placement (Signed)
   CLINICAL SOCIAL WORK PLACEMENT  NOTE  Date:  05/12/2015  Patient Details  Name: [name] MRN: [id number] Date of Birth: 1925-03-21  Clinical Social Work is seeking post-discharge placement for this patient at the Warden level of care (*CSW will initial, date and re-position this form in  chart as items are completed):  Yes   Patient/family provided with [place] Work Department's list of facilities offering this level of care within the geographic area requested by the patient (or if unable, by the patient's family).  Yes   Patient/family informed of their freedom to choose among providers that offer the needed level of care, that participate in Medicare, Medicaid or managed care program needed by the patient, have an available bed and are willing to accept the patient.  Yes   Patient/family informed of [facility]'s ownership interest in [facility] and [facility], as well as of the fact that they are under no obligation to receive care at these facilities.  PASRR submitted to EDS on 05/10/15     PASRR number received on 05/10/15     Existing PASRR number confirmed on       FL2 transmitted to all facilities in geographic area requested by pt/family on 05/10/15     FL2 transmitted to all facilities within larger geographic area on       Patient informed that his/her managed care company has contracts with or will negotiate with certain facilities, including the following:        Yes   Patient/family informed of bed offers received.  Patient chooses bed at  [facility])     Physician recommends and patient chooses bed at      Patient to be transferred to  [facility]) on 05/12/15.  Patient to be transferred to facility by  (EMS)     Patient family notified on 05/12/15 of transfer.  Name of family member notified:   (Daughter  [name])     PHYSICIAN       Additional Comment:     _______________________________________________ Maurine Cane, LCSW 05/12/2015, 9:07 AM

## 2015-05-12 NOTE — Progress Notes (Signed)
Clinical Social Worker informed by  Loletha Grayer, MD that patient is medically ready to discharge to SNF, Patient and  Daughter are in a agreement with plan.  [facility] provided patient's room number [id number] and number to call for report [phone] . All discharge information faxed to  Facility. Rx's and DNR added to discharge packet.   RN will call report and patient will discharge to [facility] via EMS.  Casimer Lanius. [place] Work Department [phone] 9:09 AM

## 2015-05-12 NOTE — Progress Notes (Signed)
Patient ID: [name], male   DOB: [birth year], 82 y.o.   MRN: [id number] [facility] PROGRESS NOTE  PCP: Otilio Miu, MD  HPI/Subjective: Patient still with rib pain.  He said its not easy to get old. Poor appetite  Objective: Filed Vitals:   05/11/15 2040 05/12/15 0444  BP: 100/55 135/89  Pulse: 87 97  Temp: 97.8 F (36.6 C) 97.5 F (36.4 C)  Resp: 17 17    Filed Weights   05/07/15 2005 05/08/15 0312  Weight: 60.691 kg (133 lb 12.8 oz) 60.328 kg (133 lb)    ROS: Review of Systems  Constitutional: Negative for fever and chills.  Eyes: Negative for blurred vision.  Respiratory: Positive for cough and shortness of breath.   Cardiovascular: Positive for chest pain.  Gastrointestinal: Negative for nausea, vomiting, abdominal pain, diarrhea and constipation.  Genitourinary: Negative for dysuria.  Musculoskeletal: Negative for joint pain.  Neurological: Negative for dizziness and headaches.   Exam: Physical Exam  Constitutional: He is oriented to person, place, and time.  HENT:  Nose: No mucosal edema.  Mouth/Throat: No oropharyngeal exudate or posterior oropharyngeal edema.  Eyes: Conjunctivae, EOM and lids are normal. Pupils are equal, round, and reactive to light.  Neck: No JVD present. Carotid bruit is not present. No edema present. No thyroid mass and no thyromegaly present.  Cardiovascular: S1 normal and S2 normal.  Exam reveals no gallop.   No murmur heard. Pulses:      Dorsalis pedis pulses are 2+ on the right side, and 2+ on the left side.  Respiratory: No respiratory distress. He has decreased breath sounds in the right lower field and the left lower field. He has no wheezes. He has no rhonchi. He has no rales.  GI: Soft. Bowel sounds are normal. There is no tenderness.  Musculoskeletal:       Right ankle: He exhibits no swelling.       Left ankle: He exhibits no swelling.  Lymphadenopathy:    He has no cervical adenopathy.   Neurological: He is alert and oriented to person, place, and time. No cranial nerve deficit.  Skin: Skin is warm. Nails show no clubbing.  Right neck with packing.  Psychiatric: He has a normal mood and affect.    Data Reviewed: Basic Metabolic Panel:  Recent Labs Lab 05/07/15 2126 05/08/15 0446 05/09/15 0634 05/11/15 0800  NA 134* 135 132* 125*  K 3.9 4.3 4.3 4.1  CL 101 104 102 88*  CO2 25 26 26 28   GLUCOSE 127* 121* 103* 102*  BUN 46* 41* 27* 21*  CREATININE 1.63* 1.36* 0.98 0.86  CALCIUM 8.5* 8.1* 8.0* 8.1*   Liver Function Tests:  Recent Labs Lab 05/07/15 2126  AST 23  ALT 22  ALKPHOS 79  BILITOT 0.5  PROT 6.4*  ALBUMIN 3.5   CBC:  Recent Labs Lab 05/07/15 2126 05/08/15 0446 05/09/15 0634 05/11/15 0800  WBC 24.3* 14.3* 12.2* 8.7  NEUTROABS 23.4*  --   --   --   HGB 12.7* 12.0* 11.7* 11.9*  HCT 39.5* 35.8* 36.0* 35.4*  MCV 89.5 89.2 88.7 88.4  PLT 259 205 185 175   Cardiac Enzymes:  Recent Labs Lab 05/07/15 2126 05/08/15 0446 05/08/15 1041  TROPONINI 0.03 0.03 0.04*     Recent Results (from the past 240 hour(s))  Culture, blood (Routine X 2) w Reflex to ID Panel     Status: None (Preliminary result)   Collection Time: 05/07/15 11:12 PM  Result  Value Ref Range Status   Specimen Description BLOOD LEFT ANTECUBITAL  Final   Special Requests BOTTLES DRAWN AEROBIC AND ANAEROBIC 10ML  Final   Culture NO GROWTH 4 DAYS  Final   Report Status PENDING  Incomplete  Culture, blood (Routine X 2) w Reflex to ID Panel     Status: None (Preliminary result)   Collection Time: 05/07/15 11:15 PM  Result Value Ref Range Status   Specimen Description BLOOD RIGHT WRIST  Final   Special Requests BOTTLES DRAWN AEROBIC AND ANAEROBIC 5ML  Final   Culture NO GROWTH 4 DAYS  Final   Report Status PENDING  Incomplete  Wound culture     Status: None (Preliminary result)   Collection Time: 05/09/15  2:30 PM  Result Value Ref Range Status   Specimen Description  NECK right neck  Final   Special Requests Immunocompromised  Final   Gram Stain RARE WBC SEEN NO ORGANISMS SEEN   Final   Culture   Final    LIGHT GROWTH GRAM NEGATIVE RODS IDENTIFICATION AND SUSCEPTIBILITIES TO FOLLOW    Report Status PENDING  Incomplete      Scheduled Meds: . budesonide-formoterol  2 puff Inhalation BID  . cephALEXin  500 mg Oral 3 times per day  . enoxaparin (LOVENOX) injection  40 mg Subcutaneous Q24H  . famotidine  20 mg Oral Daily  . folic acid  2 mg Oral Daily  . methotrexate  15 mg Oral Weekly  . metroNIDAZOLE  500 mg Oral 3 times per day  . nicotine  21 mg Transdermal Daily  . polyethylene glycol  17 g Oral Daily  . predniSONE  5 mg Oral Daily  . tamsulosin  0.4 mg Oral Daily  . temazepam  7.5 mg Oral QHS  . tiotropium  18 mcg Inhalation Q0600    Assessment/Plan:  1. Clinical sepsis present on admission, possible abscess right neck, pneumonia, tachycardia and leukocytosis. In speaking with infectious disease specialist switch over to Keflex and Flagyl for 21 days upon discharge to rehabilitation tomorrow.  DO NOT RESUSCITATE. 2. 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 Has undergone radiation therapy. Patient with continued drainage from the area. Overall prognosis poor.  DO NOT RESUSCITATE. 3. Rib fractures and chest pain- when necessary pain medication 4. History of COPD 5. History of rheumatoid arthritis on prednisone chronically 6. BPH on Flomax  Code Status:  DO NOT RESUSCITATE  Family Communication: Spoke with daughter yesterday about plan for discharge today Disposition Plan: Rehabilitation today with palliative care consult  Consultants:  ENT  Infectious disease  Palliative care  Antibiotics:  Keflex and Flagyl.  Time spent: 31 minutes  Loletha Grayer  [facility]

## 2015-05-14 ENCOUNTER — Other Ambulatory Visit: Payer: Self-pay

## 2015-05-14 LAB — WOUND CULTURE

## 2015-05-15 ENCOUNTER — Telehealth: Payer: Self-pay | Admitting: Infectious Diseases

## 2015-05-15 LAB — MISC LABCORP TEST (SEND OUT): LABCORP TEST CODE: 9985

## 2015-05-15 NOTE — Telephone Encounter (Signed)
Reviewed cx from neck., Grew multiple pathogens. Is on keflex and flagyl [facility] [phone] Order to start cipro to cover citrobacter and proteus Cont keflex for the MSSA cipro will also likely cover the rare growth of enterococus.

## 2015-05-28 ENCOUNTER — Encounter: Payer: Medicare Other | Admitting: Surgery

## 2015-05-28 DIAGNOSIS — Z85828 Personal history of other malignant neoplasm of skin: Secondary | ICD-10-CM | POA: Diagnosis not present

## 2015-05-28 DIAGNOSIS — L98492 Non-pressure chronic ulcer of skin of other sites with fat layer exposed: Secondary | ICD-10-CM | POA: Diagnosis present

## 2015-05-28 DIAGNOSIS — C4440 Unspecified malignant neoplasm of skin of scalp and neck: Secondary | ICD-10-CM | POA: Diagnosis not present

## 2015-05-28 DIAGNOSIS — C44319 Basal cell carcinoma of skin of other parts of face: Secondary | ICD-10-CM | POA: Diagnosis not present

## 2015-05-29 NOTE — Progress Notes (Addendum)
SHAKEER, GIORNO ([id number]) Visit Report for 05/28/2015 Arrival Information Details Patient Name: ORBAN, MCCRUDDEN. Date of Service: 05/28/2015 3:00 PM Medical Record Number: [id number] Patient Account Number: [id number] Date of Birth/Sex: 30-Jan-1925 (82 y.o. Male) Treating RN: Montey Hora Primary Care Physician: Otilio Miu Other Clinician: Referring Physician: Otilio Miu Treating Physician/Extender: Frann Rider in Treatment: 7 Visit Information History Since Last Visit Added or deleted any medications: No Patient Arrived: Wheel Chair Any new allergies or adverse reactions: No Arrival Time: 15:11 Had a fall or experienced change in No Accompanied By: dtr activities of daily living that may affect Transfer Assistance: None risk of falls: Patient Identification Verified: Yes Signs or symptoms of abuse/neglect since last No Secondary Verification Process Yes visito Completed: Hospitalized since last visit: No Patient Has Alerts: Yes Pain Present Now: Yes Patient Alerts: Patient on Blood Thinner Electronic Signature(s) Signed: 05/28/2015 5:07:25 PM By: Montey Hora Entered By: Montey Hora on 05/28/2015 15:11:51 Darwish, [name] ([id number]) -------------------------------------------------------------------------------- Clinic Level of Care Assessment Details Patient Name: [name]. Date of Service: 05/28/2015 3:00 PM Medical Record Number: [id number] Patient Account Number: [id number] Date of Birth/Sex: 29-Oct-1924 (82 y.o. Male) Treating RN: Montey Hora Primary Care Physician: Otilio Miu Other Clinician: Referring Physician: Otilio Miu Treating Physician/Extender: Frann Rider in Treatment: 7 Clinic Level of Care Assessment Items TOOL 4 Quantity Score []  - Use when only an EandM is performed on FOLLOW-UP visit 0 ASSESSMENTS - Nursing Assessment / Reassessment X - Reassessment of Co-morbidities (includes updates in patient status)  1 10 X - Reassessment of Adherence to Treatment Plan 1 5 ASSESSMENTS - Wound and Skin Assessment / Reassessment X - Simple Wound Assessment / Reassessment - one wound 1 5 []  - Complex Wound Assessment / Reassessment - multiple wounds 0 []  - Dermatologic / Skin Assessment (not related to wound area) 0 ASSESSMENTS - Focused Assessment []  - Circumferential Edema Measurements - multi extremities 0 []  - Nutritional Assessment / Counseling / Intervention 0 []  - Lower Extremity Assessment (monofilament, tuning fork, pulses) 0 []  - Peripheral Arterial Disease Assessment (using hand held doppler) 0 ASSESSMENTS - Ostomy and/or Continence Assessment and Care []  - Incontinence Assessment and Management 0 []  - Ostomy Care Assessment and Management (repouching, etc.) 0 PROCESS - Coordination of Care X - Simple Patient / Family Education for ongoing care 1 15 []  - Complex (extensive) Patient / Family Education for ongoing care 0 []  - Staff obtains Programmer, systems, Records, Test Results / Process Orders 0 []  - Staff telephones HHA, Nursing Homes / Clarify orders / etc 0 []  - Routine Transfer to another Facility (non-emergent condition) 0 JESEAN, WICHERT ([id number]) []  - Routine Hospital Admission (non-emergent condition) 0 []  - New Admissions / Biomedical engineer / Ordering NPWT, Apligraf, etc. 0 []  - Emergency Hospital Admission (emergent condition) 0 X - Simple Discharge Coordination 1 10 []  - Complex (extensive) Discharge Coordination 0 PROCESS - Special Needs []  - Pediatric / Minor Patient Management 0 []  - Isolation Patient Management 0 []  - Hearing / Language / Visual special needs 0 []  - Assessment of Community assistance (transportation, D/C planning, etc.) 0 []  - Additional assistance / Altered mentation 0 []  - Support Surface(s) Assessment (bed, cushion, seat, etc.) 0 INTERVENTIONS - Wound Cleansing / Measurement X - Simple Wound Cleansing - one wound 1 5 []  - Complex Wound Cleansing -  multiple wounds 0 X - Wound Imaging (photographs - any number of wounds) 1 5 []  - Wound Tracing (instead of photographs) 0  X - Simple Wound Measurement - one wound 1 5 []  - Complex Wound Measurement - multiple wounds 0 INTERVENTIONS - Wound Dressings X - Small Wound Dressing one or multiple wounds 1 10 []  - Medium Wound Dressing one or multiple wounds 0 []  - Large Wound Dressing one or multiple wounds 0 []  - Application of Medications - topical 0 []  - Application of Medications - injection 0 INTERVENTIONS - Miscellaneous []  - External ear exam 0 Abend, Hampton C. ([id number]) []  - Specimen Collection (cultures, biopsies, blood, body fluids, etc.) 0 []  - Specimen(s) / Culture(s) sent or taken to Lab for analysis 0 []  - Patient Transfer (multiple staff / Harrel Lemon Lift / Similar devices) 0 []  - Simple Staple / Suture removal (25 or less) 0 []  - Complex Staple / Suture removal (26 or more) 0 []  - Hypo / Hyperglycemic Management (close monitor of Blood Glucose) 0 []  - Ankle / Brachial Index (ABI) - do not check if billed separately 0 X - Vital Signs 1 5 Has the patient been seen at the hospital within the last three years: Yes Total Score: 75 Level Of Care: New/Established - Level 2 Electronic Signature(s) Signed: 05/28/2015 5:07:25 PM By: Montey Hora Entered By: Montey Hora on 05/28/2015 15:34:16 Horrell, [name] ([id number]) -------------------------------------------------------------------------------- Encounter Discharge Information Details Patient Name: [name]. Date of Service: 05/28/2015 3:00 PM Medical Record Number: [id number] Patient Account Number: [id number] Date of Birth/Sex: 1924-07-13 (82 y.o. Male) Treating RN: Montey Hora Primary Care Physician: Otilio Miu Other Clinician: Referring Physician: Otilio Miu Treating Physician/Extender: Frann Rider in Treatment: 7 Encounter Discharge Information Items Discharge Pain Level: 0 Discharge  Condition: Stable Ambulatory Status: Wheelchair Discharge Destination: Nursing Home Transportation: Private Auto Accompanied By: dtr Schedule Follow-up Appointment: Yes Medication Reconciliation completed and provided to Patient/Care No Maggi Hershkowitz: Provided on Clinical Summary of Care: 05/28/2015 Form Type Recipient Paper Patient JS Electronic Signature(s) Signed: 05/28/2015 4:40:57 PM By: Montey Hora Previous Signature: 05/28/2015 3:42:02 PM Version By: Ruthine Dose Entered By: Montey Hora on 05/28/2015 16:40:56 Albo, [name] ([id number]) -------------------------------------------------------------------------------- Multi Wound Chart Details Patient Name: [name]. Date of Service: 05/28/2015 3:00 PM Medical Record Number: [id number] Patient Account Number: [id number] Date of Birth/Sex: [birth year] (82 y.o. Male) Treating RN: Montey Hora Primary Care Physician: Otilio Miu Other Clinician: Referring Physician: Otilio Miu Treating Physician/Extender: Frann Rider in Treatment: 7 Vital Signs Height(in): 72 Pulse(bpm): 88 Weight(lbs): 141 Blood Pressure 84/45 (mmHg): Body Mass Index(BMI): 19 Temperature(F): Respiratory Rate 18 (breaths/min): Photos: [1:No Photos] [2:No Photos] [N/A:N/A] Wound Location: [1:Right Neck] [2:Right Ear] [N/A:N/A] Wounding Event: [1:Gradually Appeared] [2:Gradually Appeared] [N/A:N/A] Primary Etiology: [1:Malignant Wound] [2:Malignant Wound] [N/A:N/A] Comorbid History: [1:Cataracts, Anemia, Chronic Obstructive Pulmonary Disease (COPD), Hypertension, Rheumatoid Arthritis, Received Chemotherapy, Received Radiation] [2:N/A] [N/A:N/A] Date Acquired: [1:02/12/2015] [2:03/12/2015] [N/A:N/A] Weeks of Treatment: [1:7] [2:7] [N/A:N/A] Wound Status: [1:Open] [2:Converted] [N/A:N/A] Measurements L x W x D 7.3x3.4x1.5 [2:N/A] [N/A:N/A] (cm) Area (cm) : [1:19.494] [2:N/A] [N/A:N/A] Volume (cm) : [1:29.24] [2:N/A]  [N/A:N/A] % Reduction in Area: [1:-195.50%] [2:N/A] [N/A:N/A] % Reduction in Volume: -195.50% [2:N/A] [N/A:N/A] Classification: [1:Full Thickness Without Exposed Support Structures] [2:Partial Thickness] [N/A:N/A] Exudate Amount: [1:Large] [2:N/A] [N/A:N/A] Exudate Type: [1:Serous] [2:N/A] [N/A:N/A] Exudate Color: [1:amber] [2:N/A] [N/A:N/A] Wound Margin: [1:Well defined, not attached N/A] [N/A:N/A] Granulation Amount: [1:None Present (0%)] [2:N/A] [N/A:N/A] Necrotic Amount: [1:Large (67-100%)] [2:N/A] [N/A:N/A] Exposed Structures: [2:N/A] [N/A:N/A] Fascia: No Fat: No Tendon: No Muscle: No Joint: No Bone: No Limited to Skin Breakdown Epithelialization: None N/A N/A Periwound Skin Texture: Edema:  Yes No Abnormalities Noted N/A Excoriation: No Induration: No Callus: No Crepitus: No Fluctuance: No Friable: No Rash: No Scarring: No Periwound Skin Maceration: Yes No Abnormalities Noted N/A Moisture: Moist: Yes Dry/Scaly: No Periwound Skin Color: Atrophie Blanche: No No Abnormalities Noted N/A Cyanosis: No Ecchymosis: No Erythema: No Hemosiderin Staining: No Mottled: No Pallor: No Rubor: No Temperature: No Abnormality N/A N/A Tenderness on Yes No N/A Palpation: Wound Preparation: Ulcer Cleansing: N/A N/A Rinsed/Irrigated with Saline Topical Anesthetic Applied: Other: lidocaine 4% Treatment Notes Electronic Signature(s) Signed: 05/28/2015 5:07:25 PM By: Montey Hora Entered By: Montey Hora on 05/28/2015 15:30:18 Irizarry, [name] ([id number]) -------------------------------------------------------------------------------- [place] Details Patient Name: EMERSEN, BARCENA. Date of Service: 05/28/2015 3:00 PM Medical Record Number: [id number] Patient Account Number: [id number] Date of Birth/Sex: Jul 08, 1924 (82 y.o. Male) Treating RN: Montey Hora Primary Care Physician: Otilio Miu Other Clinician: Referring Physician: Otilio Miu Treating Physician/Extender: Frann Rider in Treatment: 7 Active Inactive Electronic Signature(s) Signed: 07/30/2015 10:36:53 AM By: Montey Hora Previous Signature: 05/28/2015 5:07:25 PM Version By: Montey Hora Entered By: Montey Hora on 07/30/2015 10:36:52 Bilotti, [name] ([id number]) -------------------------------------------------------------------------------- Patient/Caregiver Education Details Patient Name: [name] Date of Service: 05/28/2015 3:00 PM Medical Record Number: [id number] Patient Account Number: [id number] Date of Birth/Gender: 09-23-1924 (82 y.o. Male) Treating RN: Montey Hora Primary Care Physician: Otilio Miu Other Clinician: Referring Physician: Otilio Miu Treating Physician/Extender: Frann Rider in Treatment: 7 Education Assessment Education Provided To: Patient and Caregiver Education Topics Provided Medication Safety: Handouts: Other: new script for lidocaine topical Methods: Explain/Verbal Responses: State content correctly Electronic Signature(s) Signed: 05/28/2015 4:41:19 PM By: Montey Hora Entered By: Montey Hora on 05/28/2015 16:41:19 Konitzer, [name] ([id number]) -------------------------------------------------------------------------------- Wound Assessment Details Patient Name: [name]. Date of Service: 05/28/2015 3:00 PM Medical Record Number: [id number] Patient Account Number: [id number] Date of Birth/Sex: 09-Nov-1924 (82 y.o. Male) Treating RN: Montey Hora Primary Care Physician: Otilio Miu Other Clinician: Referring Physician: Otilio Miu Treating Physician/Extender: Frann Rider in Treatment: 7 Wound Status Wound Number: 1 Primary Malignant Wound Etiology: Wound Location: Right Neck Wound Open Wounding Event: Gradually Appeared Status: Date Acquired: 02/12/2015 Comorbid Cataracts, Anemia, Chronic Obstructive Weeks Of Treatment: 7 History: Pulmonary  Disease (COPD), Clustered Wound: No Hypertension, Rheumatoid Arthritis, Received Chemotherapy, Received Radiation Photos Photo Uploaded By: Montey Hora on 05/28/2015 16:44:49 Wound Measurements Length: (cm) 7.3 Width: (cm) 3.4 Depth: (cm) 1.5 Area: (cm) 19.494 Volume: (cm) 29.24 % Reduction in Area: -195.5% % Reduction in Volume: -195.5% Epithelialization: None Tunneling: No Undermining: No Wound Description Full Thickness Without Exposed Foul Odor After Classification: Support Structures Wound Margin: Well defined, not attached Exudate Large Amount: Exudate Type: Serous Exudate Color: amber Cleansing: No Wound Bed Fallert, Trevonne C. ([id number]) Granulation Amount: None Present (0%) Exposed Structure Necrotic Amount: Large (67-100%) Fascia Exposed: No Necrotic Quality: Adherent Slough Fat Layer Exposed: No Tendon Exposed: No Muscle Exposed: No Joint Exposed: No Bone Exposed: No Limited to Skin Breakdown Periwound Skin Texture Texture Color No Abnormalities Noted: No No Abnormalities Noted: No Callus: No Atrophie Blanche: No Crepitus: No Cyanosis: No Excoriation: No Ecchymosis: No Fluctuance: No Erythema: No Friable: No Hemosiderin Staining: No Induration: No Mottled: No Localized Edema: Yes Pallor: No Rash: No Rubor: No Scarring: No Temperature / Pain Moisture Temperature: No Abnormality No Abnormalities Noted: No Tenderness on Palpation: Yes Dry / Scaly: No Maceration: Yes Moist: Yes Wound Preparation Ulcer Cleansing: Rinsed/Irrigated with Saline Topical Anesthetic Applied: Other: lidocaine 4%, Electronic Signature(s) Signed: 05/28/2015 5:07:25 PM By:  Dorthy, Di Kindle Entered By: Montey Hora on 05/28/2015 15:28:23 [name] ([id number]) -------------------------------------------------------------------------------- Wound Assessment Details Patient Name: CHANGA, WILLINGER. Date of Service: 05/28/2015 3:00 PM Medical Record  Number: [id number] Patient Account Number: [id number] Date of Birth/Sex: 08/23/1924 (82 y.o. Male) Treating RN: Montey Hora Primary Care Physician: Otilio Miu Other Clinician: Referring Physician: Otilio Miu Treating Physician/Extender: Frann Rider in Treatment: 7 Wound Status Wound Number: 2 Primary Etiology: Malignant Wound Wound Location: Right Ear Wound Status: Converted Wounding Event: Gradually Appeared Date Acquired: 03/12/2015 Weeks Of Treatment: 7 Clustered Wound: No Wound Description Classification: Partial Thickness Periwound Skin Texture Texture Color No Abnormalities Noted: No No Abnormalities Noted: No Moisture No Abnormalities Noted: No Electronic Signature(s) Signed: 05/28/2015 5:07:25 PM By: Montey Hora Entered By: Montey Hora on 05/28/2015 15:20:45 Campillo, [name] ([id number]) -------------------------------------------------------------------------------- Vitals Details Patient Name: [name]. Date of Service: 05/28/2015 3:00 PM Medical Record Number: [id number] Patient Account Number: [id number] Date of Birth/Sex: May 07, 1924 (82 y.o. Male) Treating RN: Montey Hora Primary Care Physician: Otilio Miu Other Clinician: Referring Physician: Otilio Miu Treating Physician/Extender: Frann Rider in Treatment: 7 Vital Signs Time Taken: 15:12 Pulse (bpm): 88 Height (in): 72 Respiratory Rate (breaths/min): 18 Weight (lbs): 141 Blood Pressure (mmHg): 84/45 Body Mass Index (BMI): 19.1 Reference Range: 80 - 120 mg / dl Electronic Signature(s) Signed: 05/28/2015 5:07:25 PM By: Montey Hora Entered By: Montey Hora on 05/28/2015 15:14:59

## 2015-05-29 NOTE — Progress Notes (Signed)
TRAY, EMANUEL ([id number]) Visit Report for 05/28/2015 Chief Complaint Document Details Patient Name: [name], [name]. Date of Service: 05/28/2015 3:00 PM Medical Record Number: [id number] Patient Account Number: [id number] Date of Birth/Sex: 02/15/1925 (82 y.o. Male) Treating RN: [name] Primary Care Physician: [name] Other Clinician: Referring Physician: Otilio Barton Treating Physician/Extender: [name] in Treatment: 7 Information Obtained from: Patient Chief Complaint Patient presents to the wound care center for a consult due non healing wound to the right ear and neck which has been there for several years and is now getting progressively worse. An open ulceration on the right neck has been there for about 4 months Electronic Signature(s) Signed: 05/28/2015 3:52:13 PM By: Christin Fudge MD, FACS Entered By: Christin Fudge on 05/28/2015 15:52:13 [name] ([id number]) -------------------------------------------------------------------------------- HPI Details Patient Name: [name]. Date of Service: 05/28/2015 3:00 PM Medical Record Number: [id number] Patient Account Number: [id number] Date of Birth/Sex: [birth year] (82 y.o. Male) Treating RN: [name] Primary Care Physician: [name] Other Clinician: Referring Physician: Otilio Barton Treating Physician/Extender: [name] in Treatment: 7 History of Present Illness Location: ulcerated area right neck Quality: Patient reports experiencing a shooting pain to affected area(s). Severity: Patient states wound are getting worse. Duration: Patient has had the wound for > 3 months prior to seeking treatment at the wound center Timing: Pain in wound is constant (hurts all the time) Context: The wound appeared gradually over time Modifying Factors: Consults to this date include:dermatology, PCP and oncology Associated Signs and Symptoms: Patient reports having increase  swelling. 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 He initially had excisional surgery and the disease got progressively worse and recurred. He then had radiation therapy in about 2014. Patient continued to have progressive disease and then had a large neck mass which was treated with Erivedge and this was given to him regularly right up to May. The patient had a lot of issues with the chemotherapy and with radiation therapy and has stopped all treatment and did not want to go back to the cancer center. His daughter was at the bedside has been doing his dressings and noted that this has now ulcerated over the last 3 months and she has been doing local care and was concerned about infection. I do not believe she understands the gravity of the situation. 05/05/2015 -- the daughter who is the caregiver and is at the bedside says that the wound is started having a bad odor and continues to have quite a bit of drainage. 05/28/2015 -- as per the daughter he was admitted to the hospital and I have reviewed details of his admission between January 9 and January 14. He was seen by infectious disease and by ENT and also been seen by palliative care was set up for hospice treatment. Electronic Signature(s) Signed: 05/28/2015 4:12:19 PM By: Christin Fudge MD, FACS Entered By: Christin Fudge on 05/28/2015 16:12:19 [name] ([id number]) -------------------------------------------------------------------------------- Physical Exam Details Patient Name: [name], [name]. Date of Service: 05/28/2015 3:00 PM Medical Record Number: [id number] Patient Account Number: [id number] Date of Birth/Sex: [birth year] (82 y.o. Male) Treating RN: [name] Primary Care Physician: [name] Other Clinician: Referring Physician: Otilio Barton Treating Physician/Extender: [name] in Treatment: 7 Constitutional . Pulse regular.  Respirations normal and unlabored. Afebrile. . Eyes Nonicteric. Reactive to light. Ears, Nose, Mouth, and Throat Lips, teeth, and gums WNL.Marland Kitchen Moist mucosa without lesions. Neck  supple and nontender. No palpable supraclavicular or cervical adenopathy. Normal sized without goiter. Respiratory WNL. No retractions.. Cardiovascular Pedal Pulses WNL. No clubbing, cyanosis or edema. Lymphatic No adneopathy. No adenopathy. No adenopathy. Musculoskeletal Adexa without tenderness or enlargement.. Digits and nails w/o clubbing, cyanosis, infection, petechiae, ischemia, or inflammatory conditions.. Integumentary (Hair, Skin) No suspicious lesions. No crepitus or fluctuance. No peri-wound warmth or erythema. No masses.Marland Kitchen Psychiatric Judgement and insight Intact.. No evidence of depression, anxiety, or agitation.. 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 No purulent drainage and no evidence of vascular structures visible Electronic Signature(s) Signed: 05/28/2015 4:13:23 PM By: Christin Fudge MD, FACS Entered By: Christin Fudge on 05/28/2015 16:13:22 [name] ([id number]) -------------------------------------------------------------------------------- Physician Orders Details Patient Name: [name]. Date of Service: 05/28/2015 3:00 PM Medical Record Number: [id number] Patient Account Number: [id number] Date of Birth/Sex: January 24, 1925 (82 y.o. Male) Treating RN: [name] Primary Care Physician: [name] Other Clinician: Referring Physician: Otilio Barton Treating Physician/Extender: [name] in Treatment: 7 Verbal / Phone Orders: Yes Clinician: Montey Barton Read Back and Verified: Yes Diagnosis Coding Wound Cleansing Wound #1 Right Neck o Clean wound with Normal Saline. Anesthetic Wound #1 Right Neck o Topical Lidocaine 4% cream applied to wound bed prior to debridement Primary  Wound Dressing Wound #1 Right Neck o Aquacel Ag Secondary Dressing Wound #1 Right Neck o ABD pad - and carboflex o Non-adherent pad Dressing Change Frequency Wound #1 Right Neck o Change dressing every other day. Follow-up Appointments Wound #1 Right Neck o Return Appointment in 1 month Medications-please add to medication list. Wound #1 Right Neck o Other: - lidocaine prescribed for use during dressing change Electronic Signature(s) Signed: 05/28/2015 4:26:31 PM By: Christin Fudge MD, FACS Signed: 05/28/2015 5:07:25 PM By: [name] Entered By: [name] on 05/28/2015 15:31:33 Sorber, JOSHEPH NICHOLES ([id number]) YANI, OGRODNIK ([id number]) -------------------------------------------------------------------------------- Problem List Details Patient Name: [name], [name]. Date of Service: 05/28/2015 3:00 PM Medical Record Number: [id number] Patient Account Number: [id number] Date of Birth/Sex: [birth year] (82 y.o. 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 Date of Service: 05/28/2015 3:00 PM Medical Record Number: [id number] Patient Account Number: [id number] Date of  Birth/Sex: [birth year] (82 y.o. Male) Treating RN: [name] Primary Care Physician: [name] Other Clinician: Referring Physician: Otilio Barton Treating Physician/Extender: [name] in Treatment: 7 Subjective Chief Complaint Information obtained from Patient Patient presents to the wound care center for a consult due non healing wound to the right ear and neck which has been there for several years and is now getting progressively worse. An open ulceration on the right neck has been there for about 4 months History of Present Illness (HPI) The following HPI elements were documented for the patient's wound: Location: ulcerated area right neck Quality: Patient reports experiencing a shooting pain to affected area(s). Severity: Patient  states wound are getting worse. Duration: Patient has had the wound for > 3 months prior to seeking treatment at the wound center Timing: Pain in wound is constant (hurts all the time) Context: The wound appeared gradually over time Modifying Factors: Consults to this date include:dermatology, PCP and oncology Associated Signs and Symptoms: Patient reports having increase swelling. 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 He initially had excisional surgery and the disease got progressively worse and recurred. He then had radiation therapy in about 2014. Patient continued to have progressive disease and then had a large neck mass which was treated with Erivedge and this was given to him regularly right up to May. The patient had a lot of issues with the chemotherapy and with radiation therapy and has stopped all treatment and did not want to go back to the cancer center. His daughter was at the bedside has been doing his dressings and noted that this has now ulcerated over the last 3 months and she has been doing local care and was concerned about infection. I do not believe she  understands the gravity of the situation. 05/05/2015 -- the daughter who is the caregiver and is at the bedside says that the wound is started having a bad odor and continues to have quite a bit of drainage. 05/28/2015 -- as per the daughter he was admitted to the hospital and I have reviewed details of his admission between January 9 and January 14. He was seen by infectious disease and by ENT and also been seen by palliative care was set up for hospice treatment. [name], [name] ([id number]) Objective Constitutional Pulse regular. Respirations normal and unlabored. Afebrile. Vitals Time Taken: 3:12 PM, Height: 72 in, Weight: 141 lbs, BMI: 19.1, Pulse: 88 bpm, Respiratory Rate: 18 breaths/min, Blood Pressure: 84/45 mmHg. Eyes Nonicteric. Reactive to light. Ears, Nose, Mouth, and Throat Lips, teeth, and gums WNL.Marland Kitchen Moist mucosa without lesions. Neck supple and nontender. No palpable supraclavicular or cervical adenopathy. Normal sized without goiter. Respiratory WNL. No retractions.. Cardiovascular Pedal Pulses WNL. No clubbing, cyanosis or edema. Lymphatic No adneopathy. No adenopathy. No adenopathy. Musculoskeletal Adexa without tenderness or enlargement.. Digits and nails w/o clubbing, cyanosis, infection, petechiae, ischemia, or inflammatory conditions.Marland Kitchen Psychiatric Judgement and insight Intact.. No evidence of depression, anxiety, or agitation.. 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 No purulent drainage and no evidence of vascular structures visible Integumentary (Hair, Skin) No suspicious lesions. No crepitus or fluctuance. No peri-wound warmth or erythema. No masses.. Wound #1 status is Open. Original cause of wound was Gradually Appeared. The wound is located on the Right Neck. The wound measures 7.3cm length x 3.4cm width x 1.5cm depth; 19.494cm^2 area and 29.24cm^3 volume. The wound is  limited to skin breakdown. There is no tunneling or undermining noted. There is a large amount of serous drainage noted. The wound margin is well defined and not attached to the wound base. There is no granulation within the wound bed. There is a large (67-100%) amount of necrotic tissue within the wound bed including Adherent Slough. The periwound skin appearance exhibited: Localized Edema, Maceration, Moist. The periwound skin appearance did not exhibit: Callus, Crepitus, Excoriation, Fluctuance, Friable, Induration, Rash, Scarring, Dry/Scaly, Atrophie Blanche, Cyanosis, Barton, [name]. ([id number]) Ecchymosis, Hemosiderin Staining, Mottled, Pallor, Rubor, Erythema. Periwound temperature was noted as No Abnormality. The periwound has tenderness on palpation.  Wound #2 status is Converted. Original cause of wound was Gradually Appeared. The wound is located on the Right Ear. Assessment Active Problems ICD-10 C44.319 - Basal cell carcinoma of skin of other parts of face C44.40 - Unspecified malignant neoplasm of skin of scalp and neck L98.492 - Non-pressure chronic ulcer of skin of other sites with fat layer exposed The daughter was at the bedside understands the prognosis and the palliative care which is being offered to the patient. We will continue with light application of Aquacel Ag and a bordered foam or gauze dressing over this. They have requested a prescription for lidocaine 4% cream and this has been given. If hospice care has any questions we would be happy to talk to them and he can come back as needed. Plan Wound Cleansing: Wound #1 Right Neck: Clean wound with Normal Saline. Anesthetic: Wound #1 Right Neck: Topical Lidocaine 4% cream applied to wound bed prior to debridement Primary Wound Dressing: Wound #1 Right Neck: Aquacel Ag Secondary Dressing: Wound #1 Right Neck: ABD pad - and carboflex Non-adherent pad Dressing Change Frequency: Wound #1 Right  Neck: [name], [name]. ([id number]) Change dressing every other day. Follow-up Appointments: Wound #1 Right Neck: Return Appointment in 1 month Medications-please add to medication list.: Wound #1 Right Neck: Other: - lidocaine prescribed for use during dressing change The daughter was at the bedside understands the prognosis and the palliative care which is being offered to the patient. We will continue with light application of Aquacel Ag and a bordered foam or gauze dressing over this. They have requested a prescription for lidocaine 4% cream and this has been given. If hospice care has any questions we would be happy to talk to them and he can come back as needed. Electronic Signature(s) Signed: 05/28/2015 4:14:21 PM By: Christin Fudge MD, FACS Entered By: Christin Fudge on 05/28/2015 16:14:21 Sarsfield, Alysia Barton ([id number]) -------------------------------------------------------------------------------- SuperBill Details Patient Name: [name]. Date of Service: 05/28/2015 Medical Record Number: [id number] Patient Account Number: [id number] Date of Birth/Sex: [birth year] (82 y.o. Male) Treating RN: [name] Primary Care Physician: [name] Other Clinician: Referring Physician: Otilio Barton Treating Physician/Extender: [name] in Treatment: 7 Diagnosis Coding ICD-10 Codes Code Description [id number] Basal cell carcinoma of skin of other parts of face C44.40 Unspecified malignant neoplasm of skin of scalp and neck L98.492 Non-pressure chronic ulcer of skin of other sites with fat layer exposed Facility Procedures CPT4 Code: [id number] Description: [phone] - WOUND CARE VISIT-LEV 2 EST PT Modifier: Quantity: 1 Physician Procedures CPT4 Code Description: [id number] - WC PHYS LEVEL 3 - EST PT ICD-10 Description Diagnosis C44.319 Basal cell carcinoma of skin of other parts of face C44.40 Unspecified malignant neoplasm of skin of scalp and L98.492 Non-pressure  chronic ulcer of skin  of other sites w Modifier: neck ith fat layer Quantity: 1 exposed Electronic Signature(s) Signed: 05/28/2015 4:14:35 PM By: Christin Fudge MD, FACS Entered By: Christin Fudge on 05/28/2015 16:14:35

## 2015-06-01 ENCOUNTER — Ambulatory Visit: Payer: Medicare Other | Admitting: Surgery

## 2015-06-25 ENCOUNTER — Ambulatory Visit: Payer: Medicare Other | Admitting: Surgery

## 2015-06-27 DEATH — deceased

## 2016-03-27 IMAGING — CT CT MAXILLOFACIAL W/ CM
4 of 7 series · 17 of 47 positions shown, 19 images · IV contrast (omnipaque)
Comparison: None.

CLINICAL DATA: Basal cell skin carcinoma right face, fell 04/21/15
with right face pain, facial wound right face related to radiation
therapy

EXAM:
CT MAXILLOFACIAL WITH CONTRAST
TECHNIQUE: Multidetector CT imaging of the maxillofacial structures was
performed with intravenous contrast. Multiplanar CT image
reconstructions were also generated. A small metallic BB was placed
on the right temple in order to reliably differentiate right from
left.
CONTRAST:  75mL OMNIPAQUE IOHEXOL 300 MG/ML  SOLN

[Series 6: sagittal soft · sagittal · 0.41mm/px · 3 of 78 slices shown]
[im 26/78  bone]
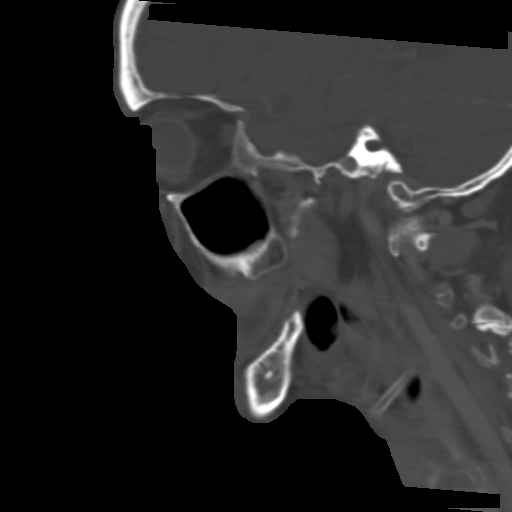
[im 39/78  bone]
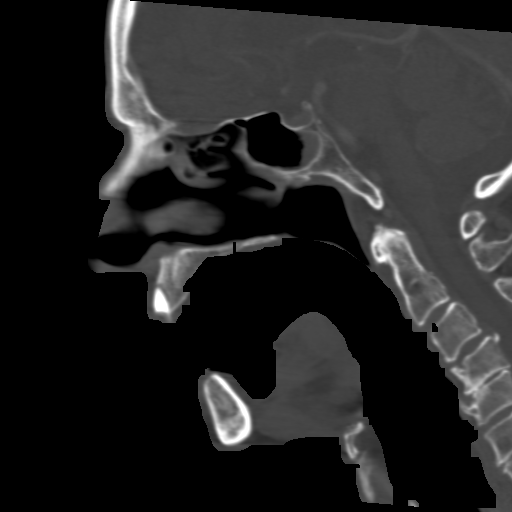
[im 52/78  bone]
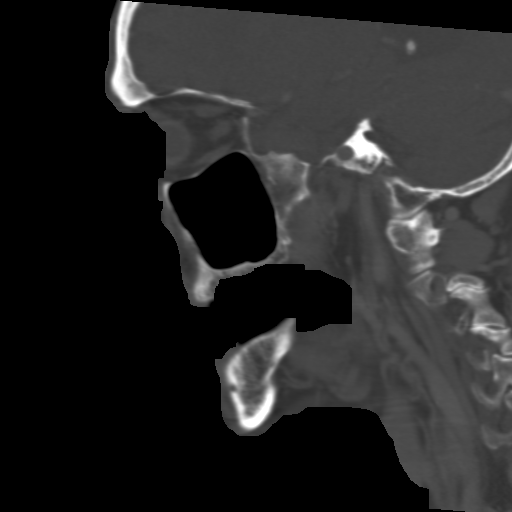

[Series 7: coronal bone · coronal · 0.41mm/px · 3 of 81 slices shown]
[im 21/81  bone]
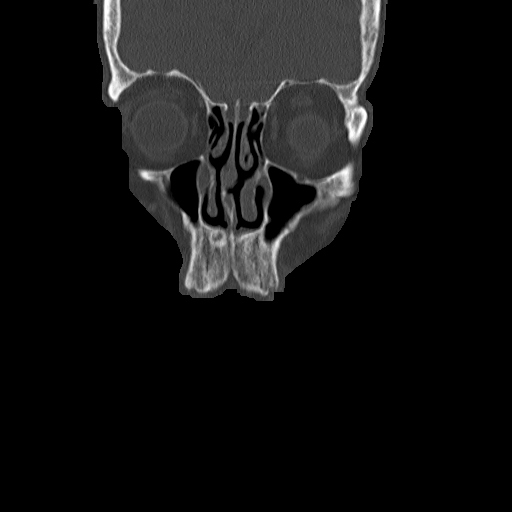
[im 41/81  bone]
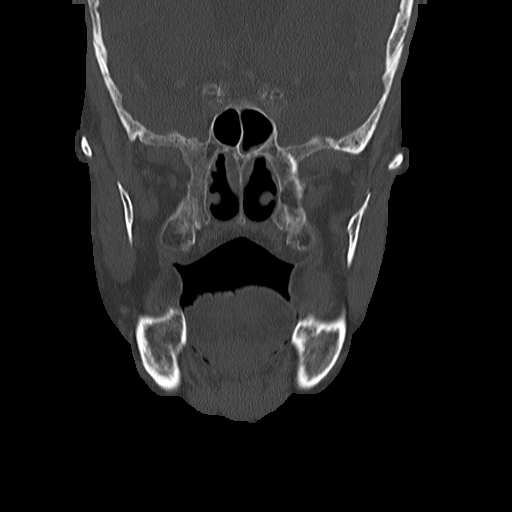
[im 61/81  bone]
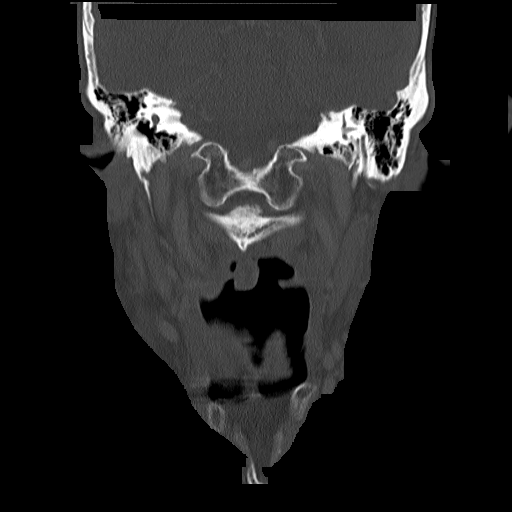

[Series 9: max soft recon · axial · 0.38mm/px · z∈[-133,-19]mm · 5 of 101 slices shown]
[im 15/101  brain]
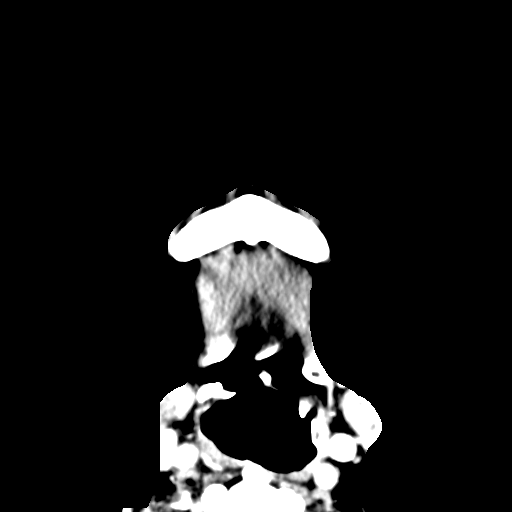
[im 29/101  brain]
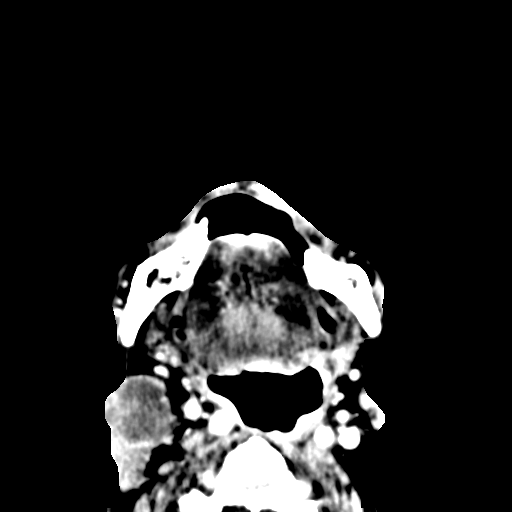
[im 43/101  brain]
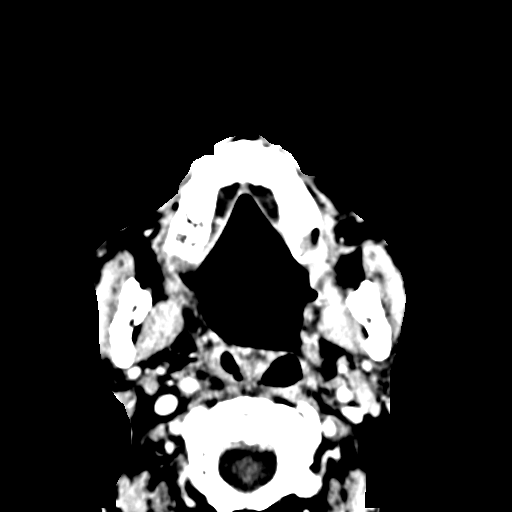
[im 58/101  brain]
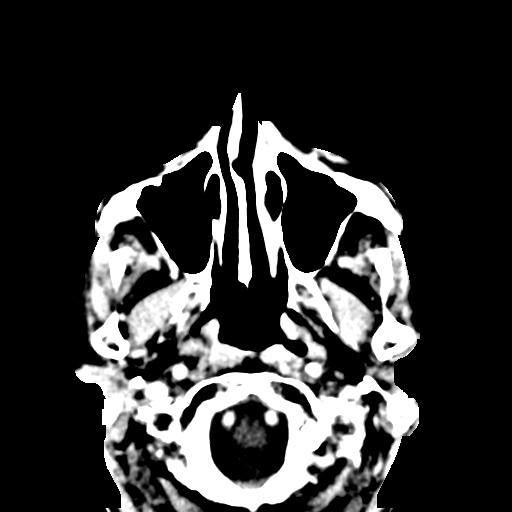
[im 72/101  brain]
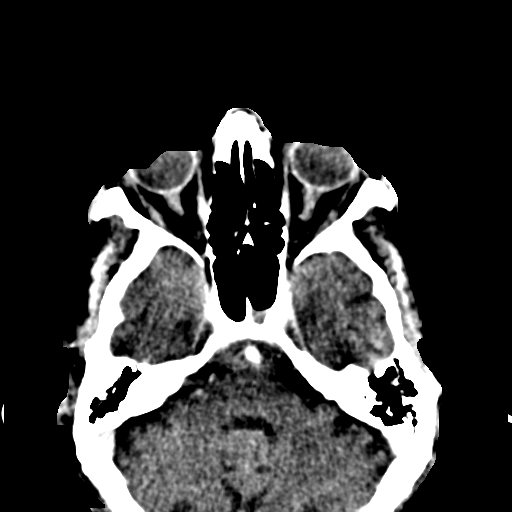

[Series 10: max bone recon · axial · 0.39mm/px · z∈[-137,+7]mm · 6 of 102 slices shown, 8 images]
[im 15/102  brain]
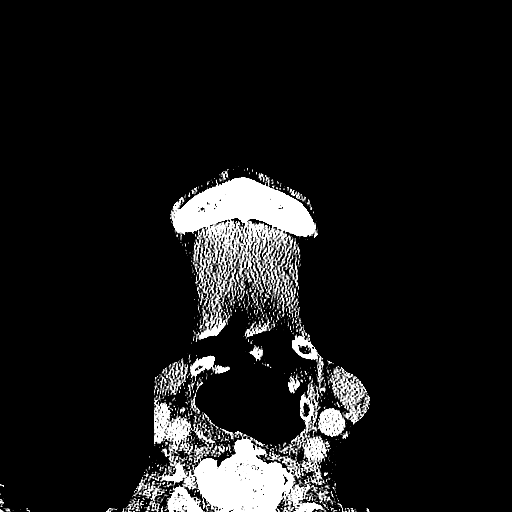
[im 15/102  bone]
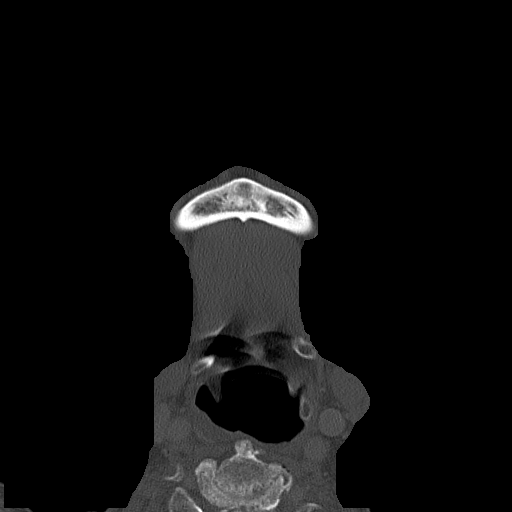
[im 29/102  bone]
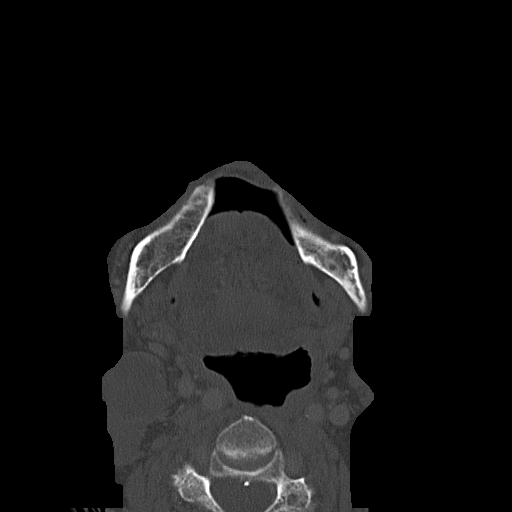
[im 44/102  bone]
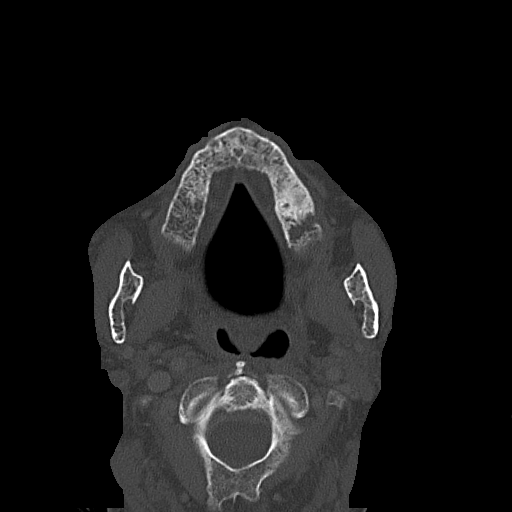
[im 58/102  bone]
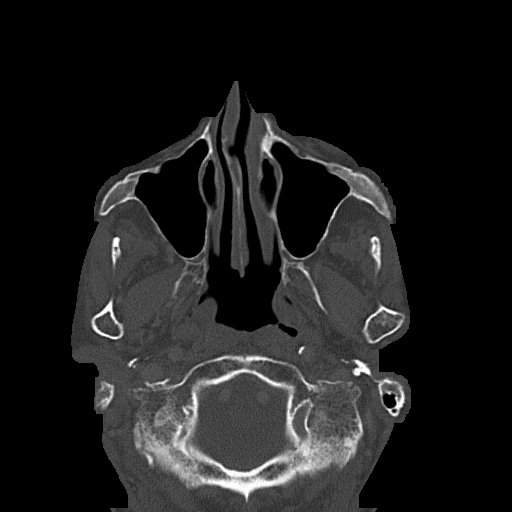
[im 73/102  brain]
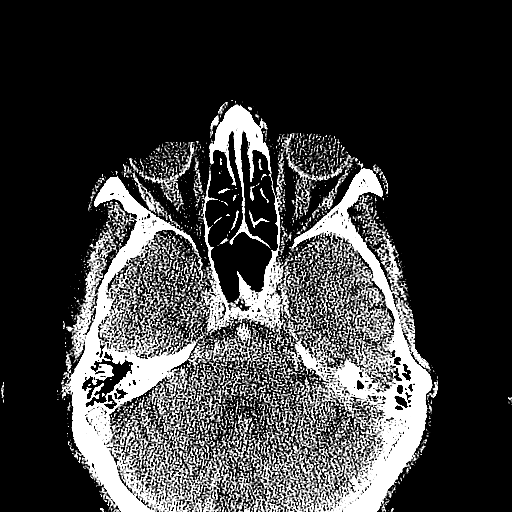
[im 73/102  bone]
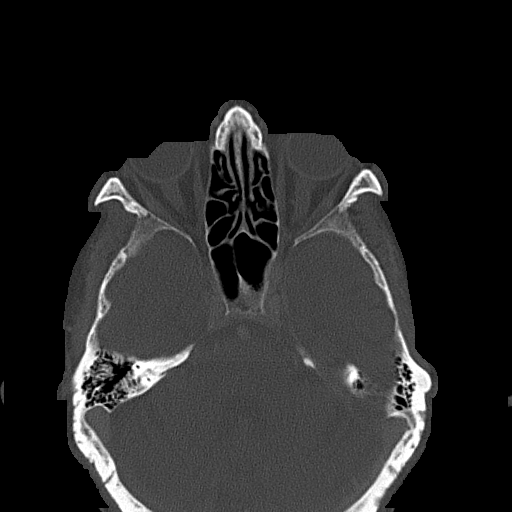
[im 87/102  bone]
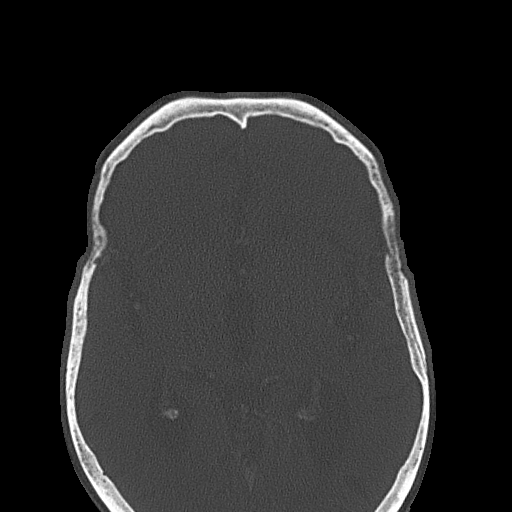

[17 of 47 positions shown; findings below may reference images not displayed]

FINDINGS: There is mild chronic inflammatory change left maxillary sinus.
Small air-fluid levels in the sphenoid sinuses. Scattered right
mastoid air cell opacification.

No evidence of facial bone fracture. Numerous dental caries
involving bilateral anterior maxillary teeth.

There is extensive subcutaneous soft tissue abnormality on the right
beginning just posterior to the external auditory canal and
extending inferiorly to the level of the epiglottis. Cranially, the
process extends to the posterior cortex of the mandibular condyles.
There is extensive overlying soft tissue ulceration. The lesion is
mildly hypo attenuating with mild rim and internal septation
enhancement. At its greatest dimension, at the angle of the
mandible, transverse diameter is 37 x 33 mm. Craniocaudal extent is
7.8 cm. Average attenuation value is 46.

The mass largely replaces the anticipated parotid gland, and exerts
post row medial mass effect on anticipated facial and vascularity.
IMPRESSION: Large subcutaneous peripherally enhancing mass on the right likely
representing known neoplasm. Differential diagnostic possibilities
include complex necrotic fluid collection including abscess. There
is a large overlying soft tissue defect.
